# Patient Record
Sex: Male | Born: 1965 | Race: Black or African American | Hispanic: No | Marital: Single | Smoking: Current every day smoker
Health system: Southern US, Community
[De-identification: ages and names within clinical notes are randomized; demographics above are authoritative.]

## PROBLEM LIST (undated history)

## (undated) DIAGNOSIS — I10 Essential (primary) hypertension: Secondary | ICD-10-CM

## (undated) DIAGNOSIS — K219 Gastro-esophageal reflux disease without esophagitis: Secondary | ICD-10-CM

## (undated) HISTORY — DX: Gastro-esophageal reflux disease without esophagitis: K21.9

---

## 1998-12-06 ENCOUNTER — Encounter: Payer: Self-pay | Admitting: *Deleted

## 1998-12-06 ENCOUNTER — Ambulatory Visit (HOSPITAL_COMMUNITY): Admission: RE | Admit: 1998-12-06 | Discharge: 1998-12-06 | Payer: Self-pay | Admitting: *Deleted

## 2001-07-09 ENCOUNTER — Emergency Department (HOSPITAL_COMMUNITY): Admission: EM | Admit: 2001-07-09 | Discharge: 2001-07-09 | Payer: Self-pay | Admitting: Emergency Medicine

## 2001-07-13 ENCOUNTER — Emergency Department (HOSPITAL_COMMUNITY): Admission: EM | Admit: 2001-07-13 | Discharge: 2001-07-13 | Payer: Self-pay | Admitting: Emergency Medicine

## 2001-07-13 ENCOUNTER — Encounter: Payer: Self-pay | Admitting: Emergency Medicine

## 2001-08-12 ENCOUNTER — Emergency Department (HOSPITAL_COMMUNITY): Admission: EM | Admit: 2001-08-12 | Discharge: 2001-08-12 | Payer: Self-pay | Admitting: Emergency Medicine

## 2002-02-05 ENCOUNTER — Emergency Department (HOSPITAL_COMMUNITY): Admission: EM | Admit: 2002-02-05 | Discharge: 2002-02-05 | Payer: Self-pay | Admitting: Emergency Medicine

## 2002-02-05 ENCOUNTER — Encounter: Payer: Self-pay | Admitting: Emergency Medicine

## 2002-04-28 ENCOUNTER — Emergency Department (HOSPITAL_COMMUNITY): Admission: EM | Admit: 2002-04-28 | Discharge: 2002-04-28 | Payer: Self-pay

## 2002-11-08 ENCOUNTER — Emergency Department (HOSPITAL_COMMUNITY): Admission: EM | Admit: 2002-11-08 | Discharge: 2002-11-08 | Payer: Self-pay | Admitting: Emergency Medicine

## 2003-03-03 ENCOUNTER — Emergency Department (HOSPITAL_COMMUNITY): Admission: EM | Admit: 2003-03-03 | Discharge: 2003-03-03 | Payer: Self-pay | Admitting: Emergency Medicine

## 2003-04-30 ENCOUNTER — Emergency Department (HOSPITAL_COMMUNITY): Admission: EM | Admit: 2003-04-30 | Discharge: 2003-04-30 | Payer: Self-pay | Admitting: Emergency Medicine

## 2003-06-09 ENCOUNTER — Emergency Department (HOSPITAL_COMMUNITY): Admission: EM | Admit: 2003-06-09 | Discharge: 2003-06-09 | Payer: Self-pay | Admitting: Emergency Medicine

## 2003-07-03 ENCOUNTER — Emergency Department (HOSPITAL_COMMUNITY): Admission: EM | Admit: 2003-07-03 | Discharge: 2003-07-03 | Payer: Self-pay | Admitting: Emergency Medicine

## 2003-07-03 ENCOUNTER — Encounter: Payer: Self-pay | Admitting: Emergency Medicine

## 2003-07-12 ENCOUNTER — Ambulatory Visit (HOSPITAL_BASED_OUTPATIENT_CLINIC_OR_DEPARTMENT_OTHER): Admission: RE | Admit: 2003-07-12 | Discharge: 2003-07-12 | Payer: Self-pay | Admitting: Dentistry

## 2003-07-15 ENCOUNTER — Inpatient Hospital Stay (HOSPITAL_COMMUNITY): Admission: EM | Admit: 2003-07-15 | Discharge: 2003-07-18 | Payer: Self-pay | Admitting: Emergency Medicine

## 2003-07-15 ENCOUNTER — Encounter: Payer: Self-pay | Admitting: Emergency Medicine

## 2003-09-25 ENCOUNTER — Emergency Department (HOSPITAL_COMMUNITY): Admission: EM | Admit: 2003-09-25 | Discharge: 2003-09-25 | Payer: Self-pay | Admitting: Emergency Medicine

## 2003-12-01 ENCOUNTER — Emergency Department (HOSPITAL_COMMUNITY): Admission: EM | Admit: 2003-12-01 | Discharge: 2003-12-01 | Payer: Self-pay | Admitting: Emergency Medicine

## 2004-05-02 ENCOUNTER — Emergency Department (HOSPITAL_COMMUNITY): Admission: EM | Admit: 2004-05-02 | Discharge: 2004-05-02 | Payer: Self-pay | Admitting: Emergency Medicine

## 2004-06-09 ENCOUNTER — Emergency Department (HOSPITAL_COMMUNITY): Admission: EM | Admit: 2004-06-09 | Discharge: 2004-06-10 | Payer: Self-pay | Admitting: Emergency Medicine

## 2004-06-10 ENCOUNTER — Emergency Department (HOSPITAL_COMMUNITY): Admission: EM | Admit: 2004-06-10 | Discharge: 2004-06-10 | Payer: Self-pay | Admitting: Emergency Medicine

## 2004-08-08 ENCOUNTER — Emergency Department (HOSPITAL_COMMUNITY): Admission: EM | Admit: 2004-08-08 | Discharge: 2004-08-08 | Payer: Self-pay | Admitting: Emergency Medicine

## 2005-03-24 ENCOUNTER — Emergency Department (HOSPITAL_COMMUNITY): Admission: EM | Admit: 2005-03-24 | Discharge: 2005-03-24 | Payer: Self-pay | Admitting: Emergency Medicine

## 2006-06-25 ENCOUNTER — Emergency Department (HOSPITAL_COMMUNITY): Admission: EM | Admit: 2006-06-25 | Discharge: 2006-06-26 | Payer: Self-pay | Admitting: Emergency Medicine

## 2007-03-10 ENCOUNTER — Emergency Department (HOSPITAL_COMMUNITY): Admission: EM | Admit: 2007-03-10 | Discharge: 2007-03-10 | Payer: Self-pay | Admitting: Emergency Medicine

## 2007-12-18 ENCOUNTER — Emergency Department (HOSPITAL_COMMUNITY): Admission: EM | Admit: 2007-12-18 | Discharge: 2007-12-19 | Payer: Self-pay | Admitting: Emergency Medicine

## 2008-09-03 ENCOUNTER — Emergency Department (HOSPITAL_COMMUNITY): Admission: EM | Admit: 2008-09-03 | Discharge: 2008-09-03 | Payer: Self-pay | Admitting: Emergency Medicine

## 2009-06-11 ENCOUNTER — Emergency Department (HOSPITAL_COMMUNITY): Admission: EM | Admit: 2009-06-11 | Discharge: 2009-06-11 | Payer: Self-pay | Admitting: Emergency Medicine

## 2009-06-12 ENCOUNTER — Emergency Department (HOSPITAL_COMMUNITY): Admission: EM | Admit: 2009-06-12 | Discharge: 2009-06-12 | Payer: Self-pay | Admitting: Emergency Medicine

## 2009-07-10 ENCOUNTER — Emergency Department (HOSPITAL_COMMUNITY): Admission: EM | Admit: 2009-07-10 | Discharge: 2009-07-10 | Payer: Self-pay | Admitting: Emergency Medicine

## 2009-10-31 ENCOUNTER — Emergency Department (HOSPITAL_COMMUNITY): Admission: EM | Admit: 2009-10-31 | Discharge: 2009-10-31 | Payer: Self-pay | Admitting: Emergency Medicine

## 2009-11-13 ENCOUNTER — Emergency Department (HOSPITAL_COMMUNITY): Admission: EM | Admit: 2009-11-13 | Discharge: 2009-11-13 | Payer: Self-pay | Admitting: Emergency Medicine

## 2009-11-14 ENCOUNTER — Inpatient Hospital Stay (HOSPITAL_COMMUNITY): Admission: EM | Admit: 2009-11-14 | Discharge: 2009-11-16 | Payer: Self-pay | Admitting: Emergency Medicine

## 2010-03-28 ENCOUNTER — Emergency Department (HOSPITAL_COMMUNITY): Admission: EM | Admit: 2010-03-28 | Discharge: 2010-03-28 | Payer: Self-pay | Admitting: Emergency Medicine

## 2010-03-30 ENCOUNTER — Emergency Department (HOSPITAL_COMMUNITY): Admission: EM | Admit: 2010-03-30 | Discharge: 2010-03-30 | Payer: Self-pay | Admitting: Emergency Medicine

## 2010-06-19 ENCOUNTER — Emergency Department (HOSPITAL_COMMUNITY): Admission: EM | Admit: 2010-06-19 | Discharge: 2010-06-19 | Payer: Self-pay | Admitting: Emergency Medicine

## 2010-06-30 ENCOUNTER — Emergency Department (HOSPITAL_COMMUNITY): Admission: EM | Admit: 2010-06-30 | Discharge: 2010-06-30 | Payer: Self-pay | Admitting: Emergency Medicine

## 2011-01-30 LAB — URINALYSIS, ROUTINE W REFLEX MICROSCOPIC
Ketones, ur: 15 mg/dL — AB
Nitrite: NEGATIVE
Urobilinogen, UA: 0.2 mg/dL (ref 0.0–1.0)
pH: 5 (ref 5.0–8.0)

## 2011-01-30 LAB — CBC
MCH: 31.2 pg (ref 26.0–34.0)
Platelets: 252 10*3/uL (ref 150–400)
RBC: 4.26 MIL/uL (ref 4.22–5.81)
WBC: 20.8 10*3/uL — ABNORMAL HIGH (ref 4.0–10.5)

## 2011-01-30 LAB — BASIC METABOLIC PANEL
CO2: 23 mEq/L (ref 19–32)
Creatinine, Ser: 1.32 mg/dL (ref 0.4–1.5)
Glucose, Bld: 74 mg/dL (ref 70–99)
Potassium: 3.1 mEq/L — ABNORMAL LOW (ref 3.5–5.1)
Sodium: 140 mEq/L (ref 135–145)

## 2011-01-30 LAB — ETHANOL: Alcohol, Ethyl (B): 128 mg/dL — ABNORMAL HIGH (ref 0–10)

## 2011-01-30 LAB — DIFFERENTIAL
Basophils Absolute: 0 10*3/uL (ref 0.0–0.1)
Eosinophils Absolute: 0 10*3/uL (ref 0.0–0.7)
Lymphocytes Relative: 4 % — ABNORMAL LOW (ref 12–46)
Lymphs Abs: 0.8 10*3/uL (ref 0.7–4.0)
Neutrophils Relative %: 94 % — ABNORMAL HIGH (ref 43–77)

## 2011-01-30 LAB — LACTIC ACID, PLASMA: Lactic Acid, Venous: 3.9 mmol/L — ABNORMAL HIGH (ref 0.5–2.2)

## 2011-01-31 LAB — BASIC METABOLIC PANEL
BUN: 6 mg/dL (ref 6–23)
Calcium: 8.3 mg/dL — ABNORMAL LOW (ref 8.4–10.5)
Creatinine, Ser: 1.09 mg/dL (ref 0.4–1.5)
GFR calc non Af Amer: >60 mL/min (ref >60)
Potassium: 4.2 mEq/L (ref 3.5–5.1)

## 2011-02-02 LAB — BASIC METABOLIC PANEL
CO2: 29 mEq/L (ref 19–32)
Chloride: 102 mEq/L (ref 96–112)
GFR calc Af Amer: >60 mL/min (ref >60)
Glucose, Bld: 97 mg/dL (ref 70–99)
Potassium: 3.6 mEq/L (ref 3.5–5.1)
Sodium: 138 mEq/L (ref 135–145)

## 2011-02-02 LAB — DIFFERENTIAL
Basophils Absolute: 0.1 10*3/uL (ref 0.0–0.1)
Basophils Relative: 2 % — ABNORMAL HIGH (ref 0–1)
Eosinophils Relative: 1 % (ref 0–5)
Monocytes Absolute: 0.6 10*3/uL (ref 0.1–1.0)
Monocytes Relative: 7 % (ref 3–12)

## 2011-02-02 LAB — CBC
HCT: 44.6 % (ref 39.0–52.0)
Hemoglobin: 14.8 g/dL (ref 13.0–17.0)
MCHC: 33.3 g/dL (ref 30.0–36.0)
RBC: 4.9 MIL/uL (ref 4.22–5.81)
RDW: 12.6 % (ref 11.5–15.5)

## 2011-02-02 LAB — WOUND CULTURE

## 2011-02-16 LAB — CBC
HCT: 41.5 % (ref 39.0–52.0)
Hemoglobin: 16.4 g/dL (ref 13.0–17.0)
MCHC: 32.8 g/dL (ref 30.0–36.0)
MCHC: 33.8 g/dL (ref 30.0–36.0)
MCV: 91 fL (ref 78.0–100.0)
Platelets: 163 10*3/uL (ref 150–400)
Platelets: 181 10*3/uL (ref 150–400)
RBC: 5.33 MIL/uL (ref 4.22–5.81)
RDW: 12.6 % (ref 11.5–15.5)
RDW: 12.8 % (ref 11.5–15.5)
RDW: 13.1 % (ref 11.5–15.5)
WBC: 8.1 10*3/uL (ref 4.0–10.5)

## 2011-02-16 LAB — COMPREHENSIVE METABOLIC PANEL
ALT: 17 U/L (ref 0–53)
AST: 26 U/L (ref 0–37)
AST: 30 U/L (ref 0–37)
Albumin: 2.9 g/dL — ABNORMAL LOW (ref 3.5–5.2)
Albumin: 3.6 g/dL (ref 3.5–5.2)
Alkaline Phosphatase: 49 U/L (ref 39–117)
BUN: 11 mg/dL (ref 6–23)
CO2: 24 mEq/L (ref 19–32)
Calcium: 7.7 mg/dL — ABNORMAL LOW (ref 8.4–10.5)
Calcium: 9.3 mg/dL (ref 8.4–10.5)
Chloride: 101 mEq/L (ref 96–112)
Creatinine, Ser: 1.38 mg/dL (ref 0.4–1.5)
GFR calc Af Amer: >60 mL/min (ref >60)
GFR calc Af Amer: >60 mL/min (ref >60)
GFR calc non Af Amer: 57 mL/min — ABNORMAL LOW (ref >60)
Glucose, Bld: 97 mg/dL (ref 70–99)
Potassium: 3.7 mEq/L (ref 3.5–5.1)
Sodium: 134 mEq/L — ABNORMAL LOW (ref 135–145)
Sodium: 137 mEq/L (ref 135–145)
Sodium: 138 mEq/L (ref 135–145)
Total Protein: 5.7 g/dL — ABNORMAL LOW (ref 6.0–8.3)
Total Protein: 6.9 g/dL (ref 6.0–8.3)
Total Protein: 7.7 g/dL (ref 6.0–8.3)

## 2011-02-16 LAB — CARDIAC PANEL(CRET KIN+CKTOT+MB+TROPI)
CK, MB: 2.9 ng/mL (ref 0.3–4.0)
CK, MB: 2.9 ng/mL (ref 0.3–4.0)
CK, MB: 3 ng/mL (ref 0.3–4.0)
CK, MB: 3.7 ng/mL (ref 0.3–4.0)
Relative Index: 0.4 (ref 0.0–2.5)
Relative Index: 0.4 (ref 0.0–2.5)
Total CK: 606 U/L — ABNORMAL HIGH (ref 7–232)
Total CK: 679 U/L — ABNORMAL HIGH (ref 7–232)
Total CK: 723 U/L — ABNORMAL HIGH (ref 7–232)
Total CK: 885 U/L — ABNORMAL HIGH (ref 7–232)
Troponin I: 0.01 ng/mL (ref 0.00–0.06)
Troponin I: 0.02 ng/mL (ref 0.00–0.06)

## 2011-02-16 LAB — CULTURE, BLOOD (ROUTINE X 2)
Culture: NO GROWTH
Culture: NO GROWTH

## 2011-02-16 LAB — TSH
TSH: 0.874 u[IU]/mL (ref 0.350–4.500)
TSH: 1.342 u[IU]/mL (ref 0.350–4.500)

## 2011-02-16 LAB — URINALYSIS, ROUTINE W REFLEX MICROSCOPIC
Bilirubin Urine: NEGATIVE
Hgb urine dipstick: NEGATIVE
Ketones, ur: >80 mg/dL — AB
Nitrite: NEGATIVE
Specific Gravity, Urine: 1.015 (ref 1.005–1.030)
Urobilinogen, UA: 0.2 mg/dL (ref 0.0–1.0)
pH: 6 (ref 5.0–8.0)

## 2011-02-16 LAB — DIFFERENTIAL
Basophils Relative: 0 % (ref 0–1)
Eosinophils Relative: 1 % (ref 0–5)
Lymphocytes Relative: 14 % (ref 12–46)
Lymphocytes Relative: 3 % — ABNORMAL LOW (ref 12–46)
Lymphs Abs: 0.6 10*3/uL — ABNORMAL LOW (ref 0.7–4.0)
Monocytes Absolute: 1.2 10*3/uL — ABNORMAL HIGH (ref 0.1–1.0)
Monocytes Relative: 15 % — ABNORMAL HIGH (ref 3–12)
Monocytes Relative: 4 % (ref 3–12)
Neutro Abs: 15.9 10*3/uL — ABNORMAL HIGH (ref 1.7–7.7)
Neutro Abs: 5.8 10*3/uL (ref 1.7–7.7)
Neutrophils Relative %: 92 % — ABNORMAL HIGH (ref 43–77)

## 2011-02-16 LAB — LIPASE, BLOOD: Lipase: 15 U/L (ref 11–59)

## 2011-02-16 LAB — RAPID URINE DRUG SCREEN, HOSP PERFORMED
Amphetamines: NOT DETECTED
Tetrahydrocannabinol: NOT DETECTED

## 2011-02-16 LAB — D-DIMER, QUANTITATIVE: D-Dimer, Quant: 0.47 ug/mL-FEU (ref 0.00–0.48)

## 2011-02-22 LAB — CBC
HCT: 44.7 % (ref 39.0–52.0)
Hemoglobin: 15.2 g/dL (ref 13.0–17.0)
MCV: 90.7 fL (ref 78.0–100.0)
RBC: 4.93 MIL/uL (ref 4.22–5.81)
WBC: 9.8 10*3/uL (ref 4.0–10.5)

## 2011-02-22 LAB — LIPASE, BLOOD: Lipase: 23 U/L (ref 11–59)

## 2011-02-22 LAB — COMPREHENSIVE METABOLIC PANEL
Alkaline Phosphatase: 72 U/L (ref 39–117)
BUN: 13 mg/dL (ref 6–23)
CO2: 26 mEq/L (ref 19–32)
Chloride: 107 mEq/L (ref 96–112)
Creatinine, Ser: 1.13 mg/dL (ref 0.4–1.5)
GFR calc non Af Amer: >60 mL/min (ref >60)
Glucose, Bld: 98 mg/dL (ref 70–99)
Potassium: 4.3 mEq/L (ref 3.5–5.1)
Total Bilirubin: 0.5 mg/dL (ref 0.3–1.2)

## 2011-02-22 LAB — DIFFERENTIAL
Basophils Absolute: 0.1 10*3/uL (ref 0.0–0.1)
Basophils Relative: 1 % (ref 0–1)
Lymphocytes Relative: 24 % (ref 12–46)
Monocytes Absolute: 0.8 10*3/uL (ref 0.1–1.0)
Neutro Abs: 6.5 10*3/uL (ref 1.7–7.7)

## 2011-02-22 LAB — URINALYSIS, ROUTINE W REFLEX MICROSCOPIC
Glucose, UA: NEGATIVE mg/dL
Hgb urine dipstick: NEGATIVE
Protein, ur: NEGATIVE mg/dL
Specific Gravity, Urine: 1.013 (ref 1.005–1.030)
pH: 7 (ref 5.0–8.0)

## 2011-02-22 LAB — HEMOCCULT GUIAC POC 1CARD (OFFICE): Fecal Occult Bld: NEGATIVE

## 2011-04-03 NOTE — Op Note (Signed)
NAME:  Joseph [name], Joseph [name] NO.:  [id number]   MEDICAL RECORD NO.:  [id number]                   PATIENT TYPE:  AMB   LOCATION:  DSC                                  FACILITY:  [facility]   PHYSICIAN:  Griffith Citron Mohorn, D.D.S.          DATE OF BIRTH:  03-Aug-1966   DATE OF PROCEDURE:  07/12/2003  DATE OF DISCHARGE:                                 OPERATIVE REPORT   PREOPERATIVE DIAGNOSIS:  Left mandibular dental abscess.   POSTOPERATIVE DIAGNOSIS:  Left mandibular dental abscess.   OPERATION PERFORMED:  Surgical extraction of tooth #18, incision and  drainage with placement of intraoral drain.   SURGEON:  Griffith Citron Mohorn, D.D.S.   ANESTHESIA:  General.   ESTIMATED BLOOD LOSS:  Minimal.   INDICATIONS FOR PROCEDURE:  The patient was evaluated in the office  approximately two days ago and was found to have a moderately swollen left  mandibular abscess with moderate trismus.  Initial drainage was established  after consultation.  It was determined that due to the abscess, the  procedure would have to be performed with at least an IV sedation.  He  returned to the office 24 hours later to have the procedure performed at  which time surgeon performed the procedure.  I was unable to get a safe  level of sedation due to the patient's excessive coughing.  The procedure  was aborted and it was determined that he would require better protection of  the airway in order to be sedated and have the tooth removed safely.  He was  scheduled for incision and drainage and extraction of tooth #18 under  general anesthesia at [facility].   DESCRIPTION OF PROCEDURE:  The patient was taken to the operating room and  placed in the supine position.  Once general anesthesia was induced by  orotracheal intubation, the patient was prepped and draped for a  maxillofacial procedure of this time.  Initially, 2mL of 2% lidocaine with  1:100,000 epinephrine was  injected into the left mandible.  Next, tooth #18  was removed surgically in multiple pieces.  No significant draining was  produced through the socket.  A stab incision made in the vestibule adjacent  to the extraction site was reopened and an additional 1.20mL of purulent  drainage was produced.  The site was irrigated copiously with normal saline.  A 1/4 inch Penrose drain was then sutured to the mucosa utilizing a 4-0 silk  suture.  The throat pack which had been placed preoperatively was then  removed and the patient was allowed to awaken and extubated without  complications. He was transferred to the PACU  in sable and satisfactory condition.  The patient will follow up in the  office in 48 hours for drain removal and will continue with his oral  antibiotics.  Griffith Citron Mohorn, D.D.S.    Gardenia Phlegm  D:  07/12/2003  T:  07/12/2003  Job:  [id number]
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
## 2011-08-07 LAB — COMPREHENSIVE METABOLIC PANEL
AST: 47 — ABNORMAL HIGH
Albumin: 4.4
BUN: 7
Chloride: 102
Creatinine, Ser: 1.41
GFR calc Af Amer: >60
Potassium: 5.3 — ABNORMAL HIGH
Total Protein: 7

## 2011-08-07 LAB — AMYLASE: Amylase: 125

## 2011-08-07 LAB — LIPASE, BLOOD: Lipase: 13

## 2011-08-07 LAB — CBC
HCT: 47.2
MCV: 88.5
Platelets: 330
RDW: 13.4
WBC: 19 — ABNORMAL HIGH

## 2011-08-07 LAB — URINALYSIS, ROUTINE W REFLEX MICROSCOPIC
Bilirubin Urine: NEGATIVE
Glucose, UA: NEGATIVE
Hgb urine dipstick: NEGATIVE
Specific Gravity, Urine: 1.022
pH: 5.5

## 2011-08-07 LAB — URINE MICROSCOPIC-ADD ON

## 2011-08-25 ENCOUNTER — Emergency Department (HOSPITAL_COMMUNITY)
Admission: EM | Admit: 2011-08-25 | Discharge: 2011-08-25 | Disposition: A | Discharge status: Home / Self Care | Payer: Self-pay | Attending: Emergency Medicine | Admitting: Emergency Medicine

## 2012-12-08 ENCOUNTER — Emergency Department (HOSPITAL_COMMUNITY)
Admission: EM | Admit: 2012-12-08 | Discharge: 2012-12-08 | Disposition: A | Discharge status: Home / Self Care | Payer: Self-pay | Attending: Emergency Medicine | Admitting: Emergency Medicine

## 2012-12-08 ENCOUNTER — Encounter (HOSPITAL_COMMUNITY): Payer: Self-pay | Admitting: Emergency Medicine

## 2012-12-08 ENCOUNTER — Emergency Department (HOSPITAL_COMMUNITY): Payer: Self-pay

## 2012-12-08 DIAGNOSIS — X58XXXA Exposure to other specified factors, initial encounter: Secondary | ICD-10-CM | POA: Insufficient documentation

## 2012-12-08 DIAGNOSIS — Y939 Activity, unspecified: Secondary | ICD-10-CM | POA: Insufficient documentation

## 2012-12-08 DIAGNOSIS — S62619A Displaced fracture of proximal phalanx of unspecified finger, initial encounter for closed fracture: Secondary | ICD-10-CM

## 2012-12-08 DIAGNOSIS — F17200 Nicotine dependence, unspecified, uncomplicated: Secondary | ICD-10-CM | POA: Insufficient documentation

## 2012-12-08 DIAGNOSIS — IMO0002 Reserved for concepts with insufficient information to code with codable children: Secondary | ICD-10-CM | POA: Insufficient documentation

## 2012-12-08 DIAGNOSIS — Y929 Unspecified place or not applicable: Secondary | ICD-10-CM | POA: Insufficient documentation

## 2012-12-08 MED ORDER — IBUPROFEN 800 MG PO TABS: 800.0000 mg | ORAL_TABLET | Freq: Three times a day (TID) | ORAL | Status: DC | PRN

## 2012-12-08 MED ORDER — HYDROCODONE-ACETAMINOPHEN 5-325 MG PO TABS: 1.0000 | ORAL_TABLET | Freq: Four times a day (QID) | ORAL | Status: DC | PRN

## 2012-12-08 NOTE — ED Notes (Signed)
Ortho at bedside to place splint.

## 2012-12-08 NOTE — ED Notes (Signed)
Pt complains of left hand pain, itching , and swelling x 1 week.

## 2012-12-08 NOTE — ED Provider Notes (Signed)
History     CSN: [id number]  Arrival date & time 12/08/12  1200   First MD Initiated Contact with Patient 12/08/12 1214      Chief Complaint  Patient presents with  . Hand Pain    (Consider location/radiation/quality/duration/timing/severity/associated sxs/prior treatment) HPI Patient presents to the emergency department complaining of left 3rd and 4th MCP pain. He says the pain is achy, worse in the morning and accompanied by stiffness in the morning. He occasionally has tingling sensations in his distal 3rd and 4th fingers. No numbness, no fever, chills, chest pain, nausea, vomiting. He does not recall an injury. 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   History reviewed. No pertinent past medical history.  History reviewed. No pertinent past surgical history.  No family history on file.  History  Substance Use Topics  . Smoking status: Current Every Day Smoker -- 3.0 packs/day    Types: Cigarettes  . Smokeless tobacco: Current User  . Alcohol Use: Yes      Review of Systems All other systems negative except as documented in the HPI. All pertinent positives and negatives as reviewed in the HPI.   Allergies  Codeine  Home Medications  No current outpatient prescriptions on file.  BP 124/69  Pulse 75  Temp 97.9 F (36.6 C) (Oral)  Resp 16  SpO2 99%  Physical Exam  Constitutional: He appears well-developed and well-nourished. No distress.  HENT:  Head: Normocephalic and atraumatic.  Eyes: Conjunctivae normal are normal. No scleral icterus.  Cardiovascular: Normal rate.   Pulmonary/Chest: Effort normal.  Musculoskeletal: Normal range of motion.       Left hand: He exhibits bony tenderness and swelling. He exhibits normal range of motion, normal capillary refill and no deformity. normal sensation noted.       Hands:   ED Course  Procedures (including critical care time)  Labs Reviewed - No data to display Dg Hand Complete Left  12/08/2012   *RADIOLOGY REPORT*  Clinical Data: Pain in the left hand.  LEFT HAND - COMPLETE 3+ VIEW  Comparison: Left fourth finger radiographs 06/09/2004.  Findings: Three views of the left hand demonstrate old healed fractures of the ulnar aspect of the base of the third proximal phalanx, and at the base of the fifth metacarpal.  There is a tiny bony fragment adjacent to the radial aspect of the base of the fourth proximal phalanx, which could represent a tiny avulsion fracture.  No other acute displaced fracture, subluxation or dislocation is noted.  IMPRESSION: 1.  Possible capsular avulsion fracture from the radial aspect of the base of the fourth proximal phalanx. 2.  Old healed fractures in the third proximal phalanx and the fifth metacarpal, as above.   Original Report Authenticated By: Trudie Reed, M.D.      The patient will be referred to hand for follow up. Return here as needed. Ice and elevate the hand  MDM          Carlyle Dolly, PA-C 12/09/12 1938

## 2012-12-10 NOTE — ED Provider Notes (Signed)
Medical screening examination/treatment/procedure(s) were performed by non-physician practitioner and as supervising physician I was immediately available for consultation/collaboration.   Rolan Bucco, MD 12/10/12 1420

## 2013-02-15 ENCOUNTER — Encounter (HOSPITAL_COMMUNITY): Payer: Self-pay | Admitting: *Deleted

## 2013-02-15 ENCOUNTER — Emergency Department (HOSPITAL_COMMUNITY): Payer: Self-pay

## 2013-02-15 ENCOUNTER — Emergency Department (HOSPITAL_COMMUNITY)
Admission: EM | Admit: 2013-02-15 | Discharge: 2013-02-15 | Disposition: A | Discharge status: Home / Self Care | Payer: Self-pay | Attending: Emergency Medicine | Admitting: Emergency Medicine

## 2013-02-15 DIAGNOSIS — J069 Acute upper respiratory infection, unspecified: Secondary | ICD-10-CM

## 2013-02-15 DIAGNOSIS — R071 Chest pain on breathing: Secondary | ICD-10-CM | POA: Insufficient documentation

## 2013-02-15 DIAGNOSIS — R059 Cough, unspecified: Secondary | ICD-10-CM | POA: Insufficient documentation

## 2013-02-15 DIAGNOSIS — F17200 Nicotine dependence, unspecified, uncomplicated: Secondary | ICD-10-CM | POA: Insufficient documentation

## 2013-02-15 DIAGNOSIS — R05 Cough: Secondary | ICD-10-CM | POA: Insufficient documentation

## 2013-02-15 LAB — BASIC METABOLIC PANEL
CO2: 24 mEq/L (ref 19–32)
Calcium: 9.1 mg/dL (ref 8.4–10.5)
GFR calc non Af Amer: >90 mL/min (ref >90)
Glucose, Bld: 91 mg/dL (ref 70–99)
Potassium: 4.3 mEq/L (ref 3.5–5.1)
Sodium: 137 mEq/L (ref 135–145)

## 2013-02-15 LAB — CBC WITH DIFFERENTIAL/PLATELET
Basophils Absolute: 0 10*3/uL (ref 0.0–0.1)
Eosinophils Absolute: 0.1 10*3/uL (ref 0.0–0.7)
Lymphocytes Relative: 20 % (ref 12–46)
Lymphs Abs: 1.5 10*3/uL (ref 0.7–4.0)
Neutrophils Relative %: 70 % (ref 43–77)
Platelets: 294 10*3/uL (ref 150–400)
RBC: 5.12 MIL/uL (ref 4.22–5.81)
RDW: 13 % (ref 11.5–15.5)
WBC: 7.5 10*3/uL (ref 4.0–10.5)

## 2013-02-15 LAB — POCT I-STAT TROPONIN I: Troponin i, poc: 0.01 ng/mL (ref 0.00–0.08)

## 2013-02-15 MED ORDER — PREDNISONE 20 MG PO TABS
60.0000 mg | ORAL_TABLET | Freq: Once | ORAL | Status: AC
  Administered 2013-02-15: 60 mg via ORAL
  Filled 2013-02-15: qty 3

## 2013-02-15 MED ORDER — ALBUTEROL SULFATE (5 MG/ML) 0.5% IN NEBU
5.0000 mg | INHALATION_SOLUTION | Freq: Once | RESPIRATORY_TRACT | Status: AC
  Administered 2013-02-15: 5 mg via RESPIRATORY_TRACT
  Filled 2013-02-15: qty 1

## 2013-02-15 MED ORDER — IPRATROPIUM BROMIDE 0.02 % IN SOLN
0.5000 mg | Freq: Once | RESPIRATORY_TRACT | Status: AC
  Administered 2013-02-15: 0.5 mg via RESPIRATORY_TRACT
  Filled 2013-02-15: qty 2.5

## 2013-02-15 MED ORDER — PREDNISONE 20 MG PO TABS: 60.0000 mg | ORAL_TABLET | Freq: Every day | ORAL | Status: DC

## 2013-02-15 NOTE — ED Notes (Signed)
Pt escorted to discharge window. Pt verbalized understanding discharge instructions. In no acute distress.  

## 2013-02-15 NOTE — ED Notes (Signed)
RT at bedside.

## 2013-02-15 NOTE — ED Notes (Signed)
Pt back from x-ray.

## 2013-02-15 NOTE — ED Notes (Addendum)
Pt reports non productive cough x3 days, chest pain and upper abdominal pain since last night 7/10. Reports SOB, speaking in full sentences. Denies n/v/d  Hx of bronchitis in past

## 2013-02-15 NOTE — ED Notes (Signed)
PA at bedside Pt alert and oriented x4. Respirations even and unlabored, bilateral symmetrical rise and fall of chest. Skin warm and dry. In no acute distress. Denies needs.   

## 2013-02-15 NOTE — ED Provider Notes (Signed)
History     CSN: [id number]  Arrival date & time 02/15/13  4098   First MD Initiated Contact with Patient 02/15/13 [phone]      Chief Complaint  Patient presents with  . Shortness of Breath  . Chest Pain    (Consider location/radiation/quality/duration/timing/severity/associated sxs/prior treatment) HPI Comments: Patient presents with a chief complaint of shortness of breath and cough.  He reports that his cough has been present for the past three days.  Cough is non productive.  He began feeling short of breath this morning around 3 AM. He reports that he also began having chest pain this morning.  However, he only has chest pain with movement and with coughing really hard.  No pain when he is lying still.   He reports that he has had similar symptoms in the past and told that he had Bronchitis.  He reports that in the past when he has had these symptoms he was given a breathing treatment in the ED, which helped.  He denies any known history of COPD.  He has smoked for 25 years.  He currently smokes 0.5 PPD.  He denies fever or chills.  He denies nausea, vomiting, or diarrhea.  Patient denies prolonged travel or surgeries in the past 4 weeks.  Denies hemoptysis.  No prior history of Cancer.  No lower extremity pain or edema.  No history of DVT or PE.  No cardiac history.  No history of HTN, DM, or Hyperlipidemia.  The history is provided by the patient.    History reviewed. No pertinent past medical history.  History reviewed. No pertinent past surgical history.  History reviewed. No pertinent family history.  History  Substance Use Topics  . Smoking status: Current Every Day Smoker -- 0.10 packs/day for 25 years    Types: Cigarettes  . Smokeless tobacco: Current User  . Alcohol Use: Yes     Comment: 6 pack/week      Review of Systems  Constitutional: Negative for fever and chills.  HENT: Positive for congestion and rhinorrhea.   Respiratory: Positive for cough and shortness of  breath.   Cardiovascular:       Chest pain only when coughing and with movement  Skin: Negative for color change.  All other systems reviewed and are negative.    Allergies  Codeine  Home Medications   Current Outpatient Rx  Name  Route  Sig  Dispense  Refill  . HYDROcodone-acetaminophen (NORCO/VICODIN) 5-325 MG per tablet   Oral   Take 1 tablet by mouth every 6 (six) hours as needed for pain.   15 tablet   0   . ibuprofen (ADVIL,MOTRIN) 800 MG tablet   Oral   Take 1 tablet (800 mg total) by mouth every 8 (eight) hours as needed for pain.   21 tablet   0     BP 125/89  Pulse 75  Temp(Src) 97.3 F (36.3 C)  Resp 17  SpO2 100%  Physical Exam  Nursing note and vitals reviewed. Constitutional: He appears well-developed and well-nourished. No distress.  HENT:  Head: Normocephalic and atraumatic.  Mouth/Throat: Oropharynx is clear and moist.  Neck: Normal range of motion. Neck supple.  Cardiovascular: Normal rate, regular rhythm and normal heart sounds.   Pulmonary/Chest: Effort normal and breath sounds normal. No respiratory distress. He has no wheezes. He has no rales. He exhibits no tenderness.  Abdominal: Soft. Bowel sounds are normal. He exhibits no distension and no mass. There is no tenderness. There  is no rebound and no guarding.  Musculoskeletal:  No LE edema or erythema.  Neurological: He is alert.  Skin: Skin is warm and dry. He is not diaphoretic.  Psychiatric: He has a normal mood and affect.    ED Course  Procedures (including critical care time)  Labs Reviewed - No data to display Dg Chest 2 View  02/15/2013  *RADIOLOGY REPORT*  Clinical Data: Left chest pain, shortness of breath, cough, smoker  CHEST - 2 VIEW  Comparison: 06/30/2010  Findings: Normal heart size, mediastinal contours, and pulmonary vascularity. Lungs clear. No pleural effusion or pneumothorax. Bones unremarkable.  IMPRESSION: No acute abnormalities.   Original Report Authenticated By:  Ulyses Southward, M.D.      No diagnosis found.  10:38 AM Patient reports that his SOB has improved after getting the breathing treatment.   Date: 02/15/2013  Rate: 74  Rhythm: normal sinus rhythm  QRS Axis: normal  Intervals: normal  ST/T Wave abnormalities: nonspecific T wave changes  Conduction Disutrbances:none  Narrative Interpretation:   Old EKG Reviewed: changes noted, t waves more prominent  Patient reports that SOB had improved after getting breathing treatment.   MDM  Patient presenting with SOB and cough.  EKG unremarkable.  CXR negative.  Negative Troponin.  Patient without overt wheezing, but given breathing treatment for possible symptomatic relief.  SOB improved after breathing treatment.  Patient is PERC negative.  Feel that patient is stable for discharge.  Symptoms most consistent with Viral URI.        Pascal Lux [place], PA-C 02/16/13 [phone]

## 2013-02-17 NOTE — ED Provider Notes (Signed)
Medical screening examination/treatment/procedure(s) were performed by non-physician practitioner and as supervising physician I was immediately available for consultation/collaboration.  Juliet Rude. Rubin Payor, MD 02/17/13 [phone]

## 2013-02-21 ENCOUNTER — Emergency Department (HOSPITAL_COMMUNITY): Payer: Self-pay

## 2013-02-21 ENCOUNTER — Encounter (HOSPITAL_COMMUNITY): Payer: Self-pay

## 2013-02-21 ENCOUNTER — Emergency Department (HOSPITAL_COMMUNITY)
Admission: EM | Admit: 2013-02-21 | Discharge: 2013-02-21 | Discharge status: Against Medical Advice | Payer: Self-pay | Attending: Emergency Medicine | Admitting: Emergency Medicine

## 2013-02-21 DIAGNOSIS — R079 Chest pain, unspecified: Secondary | ICD-10-CM | POA: Insufficient documentation

## 2013-02-21 DIAGNOSIS — F17200 Nicotine dependence, unspecified, uncomplicated: Secondary | ICD-10-CM | POA: Insufficient documentation

## 2013-02-21 LAB — POCT I-STAT, CHEM 8
Creatinine, Ser: 1.4 mg/dL — ABNORMAL HIGH (ref 0.50–1.35)
HCT: 48 % (ref 39.0–52.0)
Hemoglobin: 16.3 g/dL (ref 13.0–17.0)
Potassium: 4.2 mEq/L (ref 3.5–5.1)
Sodium: 141 mEq/L (ref 135–145)
TCO2: 30 mmol/L (ref 0–100)

## 2013-02-21 LAB — D-DIMER, QUANTITATIVE: D-Dimer, Quant: 0.32 ug/mL-FEU (ref 0.00–0.48)

## 2013-02-21 LAB — POCT I-STAT TROPONIN I: Troponin i, poc: 0 ng/mL (ref 0.00–0.08)

## 2013-02-21 LAB — URINALYSIS, ROUTINE W REFLEX MICROSCOPIC
Hgb urine dipstick: NEGATIVE
Leukocytes, UA: NEGATIVE
Nitrite: NEGATIVE
Protein, ur: NEGATIVE mg/dL
Specific Gravity, Urine: 1.014 (ref 1.005–1.030)
Urobilinogen, UA: 0.2 mg/dL (ref 0.0–1.0)

## 2013-02-21 LAB — CBC
HCT: 42.7 % (ref 39.0–52.0)
MCHC: 34 g/dL (ref 30.0–36.0)
Platelets: 316 10*3/uL (ref 150–400)
RDW: 13.3 % (ref 11.5–15.5)
WBC: 8.8 10*3/uL (ref 4.0–10.5)

## 2013-02-21 NOTE — ED Notes (Signed)
Pt states seen here last week for same, c/o center chest pain radiating to upper epigastric area and through to rt flank/back area with SOB, denies n/v

## 2013-02-21 NOTE — ED Provider Notes (Signed)
History     CSN: [id number]  Arrival date & time 02/21/13  4098   First MD Initiated Contact with Patient 02/21/13 1047      Chief Complaint  Patient presents with  . Chest Pain    (Consider location/radiation/quality/duration/timing/severity/associated sxs/prior treatment) Patient is a 47 y.o. male presenting with chest pain. The history is provided by the patient.  Chest Pain  patient is here complaining of chest discomfort that is intermittent x2 weeks. Pain characterized as pressure and localized to his upper epigastric area going through to his back. Worse with certain positions. No anginal type symptoms of diaphoresis or dyspnea. No exertional component to this. Questionable association with food. No prior history of angina. No medications taken for this prior to arrival. Seen in the ER for similar symptoms recently without diagnosis.  History reviewed. No pertinent past medical history.  History reviewed. No pertinent past surgical history.  No family history on file.  History  Substance Use Topics  . Smoking status: Current Every Day Smoker -- 0.10 packs/day for 25 years    Types: Cigarettes  . Smokeless tobacco: Current User  . Alcohol Use: Yes     Comment: 6 pack/week      Review of Systems  Cardiovascular: Positive for chest pain.  All other systems reviewed and are negative.    Allergies  Codeine  Home Medications  No current outpatient prescriptions on file.  BP 132/73  Pulse 92  Temp(Src) 98 F (36.7 C) (Oral)  Resp 16  SpO2 100%  Physical Exam  Nursing note and vitals reviewed. Constitutional: He is oriented to person, place, and time. He appears well-developed and well-nourished.  Non-toxic appearance. No distress.  HENT:  Head: Normocephalic and atraumatic.  Eyes: Conjunctivae, EOM and lids are normal. Pupils are equal, round, and reactive to light.  Neck: Normal range of motion. Neck supple. No tracheal deviation present. No mass present.   Cardiovascular: Normal rate, regular rhythm and normal heart sounds.  Exam reveals no gallop.   No murmur heard. Pulmonary/Chest: Effort normal and breath sounds normal. No stridor. No respiratory distress. He has no decreased breath sounds. He has no wheezes. He has no rhonchi. He has no rales.  Abdominal: Soft. Normal appearance and bowel sounds are normal. He exhibits no distension. There is no tenderness. There is no rebound and no CVA tenderness.  Musculoskeletal: Normal range of motion. He exhibits no edema and no tenderness.  Neurological: He is alert and oriented to person, place, and time. He has normal strength. No cranial nerve deficit or sensory deficit. GCS eye subscore is 4. GCS verbal subscore is 5. GCS motor subscore is 6.  Skin: Skin is warm and dry. No abrasion and no rash noted.  Psychiatric: He has a normal mood and affect. His speech is normal and behavior is normal.    ED Course  Procedures (including critical care time)  Labs Reviewed  POCT I-STAT, CHEM 8 - Abnormal; Notable for the following:    Creatinine, Ser 1.40 (*)    All other components within normal limits  CBC  URINALYSIS, ROUTINE W REFLEX MICROSCOPIC  D-DIMER, QUANTITATIVE  POCT I-STAT TROPONIN I   No results found.   No diagnosis found.    MDM   Date: 02/21/2013  Rate: 89  Rhythm: normal sinus rhythm  QRS Axis: normal  Intervals: normal  ST/T Wave abnormalities: early repolarization  Conduction Disutrbances:none  Narrative Interpretation:   Old EKG Reviewed: none available   Pt left before  I could give him his results       Toy Baker, MD 02/21/13 1253

## 2013-02-21 NOTE — ED Notes (Signed)
Bed:WA01<BR> Expected date:<BR> Expected time:<BR> Means of arrival:<BR> Comments:<BR>

## 2013-02-21 NOTE — ED Notes (Signed)
Pt not in room at this time

## 2013-10-27 ENCOUNTER — Encounter (HOSPITAL_COMMUNITY): Payer: Self-pay | Admitting: Emergency Medicine

## 2013-10-27 ENCOUNTER — Emergency Department (HOSPITAL_COMMUNITY)
Admission: EM | Admit: 2013-10-27 | Discharge: 2013-10-27 | Disposition: A | Discharge status: Home / Self Care | Payer: Self-pay | Attending: Emergency Medicine | Admitting: Emergency Medicine

## 2013-10-27 DIAGNOSIS — Y939 Activity, unspecified: Secondary | ICD-10-CM | POA: Insufficient documentation

## 2013-10-27 DIAGNOSIS — S60219A Contusion of unspecified wrist, initial encounter: Secondary | ICD-10-CM | POA: Insufficient documentation

## 2013-10-27 DIAGNOSIS — M25531 Pain in right wrist: Secondary | ICD-10-CM

## 2013-10-27 DIAGNOSIS — X58XXXA Exposure to other specified factors, initial encounter: Secondary | ICD-10-CM | POA: Insufficient documentation

## 2013-10-27 DIAGNOSIS — S59909A Unspecified injury of unspecified elbow, initial encounter: Secondary | ICD-10-CM | POA: Insufficient documentation

## 2013-10-27 DIAGNOSIS — F17200 Nicotine dependence, unspecified, uncomplicated: Secondary | ICD-10-CM | POA: Insufficient documentation

## 2013-10-27 DIAGNOSIS — S60212A Contusion of left wrist, initial encounter: Secondary | ICD-10-CM

## 2013-10-27 DIAGNOSIS — IMO0002 Reserved for concepts with insufficient information to code with codable children: Secondary | ICD-10-CM | POA: Insufficient documentation

## 2013-10-27 DIAGNOSIS — Y929 Unspecified place or not applicable: Secondary | ICD-10-CM | POA: Insufficient documentation

## 2013-10-27 DIAGNOSIS — S6990XA Unspecified injury of unspecified wrist, hand and finger(s), initial encounter: Secondary | ICD-10-CM | POA: Insufficient documentation

## 2013-10-27 DIAGNOSIS — Z791 Long term (current) use of non-steroidal anti-inflammatories (NSAID): Secondary | ICD-10-CM | POA: Insufficient documentation

## 2013-10-27 MED ORDER — IBUPROFEN 600 MG PO TABS: 600.0000 mg | ORAL_TABLET | Freq: Three times a day (TID) | ORAL | Status: DC

## 2013-10-27 MED ORDER — IBUPROFEN 200 MG PO TABS: 600.0000 mg | ORAL_TABLET | Freq: Once | ORAL | Status: DC

## 2013-10-27 NOTE — ED Provider Notes (Addendum)
CSN: [id number]     Arrival date & time 10/27/13  8295 History   First MD Initiated Contact with Patient 10/27/13 0459     Chief Complaint  Patient presents with  . Back Pain   (Consider location/radiation/quality/duration/timing/severity/associated sxs/prior Treatment) HPI Comments: Patient comes into the emergency room accompanied by [employer].  Complaining of bilateral wrist discomfort, left greater than right.  He also states, that at the base of his left thumb.  There is some numbness.  He has full range of motion of his fingers, wrist, and elbows is no bruising.  Minor swelling  Patient is a 47 y.o. male presenting with back pain. The history is provided by the patient.  Back Pain Pain location: Bilateral wrists, left worse than right. Quality:  Aching Radiates to:  Does not radiate Pain severity:  Mild Onset quality:  Gradual Duration:  2 hours Timing:  Constant Progression:  Improving Chronicity:  New Relieved by:  None tried Worsened by:  Movement Ineffective treatments:  None tried Associated symptoms: numbness   Associated symptoms: no fever     History reviewed. No pertinent past medical history. No past surgical history on file. History reviewed. No pertinent family history. History  Substance Use Topics  . Smoking status: Current Every Day Smoker -- 0.10 packs/day for 25 years    Types: Cigarettes  . Smokeless tobacco: Current User  . Alcohol Use: Yes     Comment: 6 pack/week    Review of Systems  Constitutional: Negative for fever.  Musculoskeletal: Positive for back pain.  Neurological: Positive for numbness. Negative for dizziness.  All other systems reviewed and are negative.    Allergies  Codeine  Home Medications   Current Outpatient Rx  Name  Route  Sig  Dispense  Refill  . ibuprofen (ADVIL,MOTRIN) 600 MG tablet   Oral   Take 1 tablet (600 mg total) by mouth 3 (three) times daily.   30 tablet   0    BP 128/82  Pulse 103  Temp(Src) 97.8  F (36.6 C) (Oral)  Resp 16  SpO2 98% Physical Exam  ED Course  Procedures (including critical care time) Labs Review Labs Reviewed - No data to display Imaging Review No results found.  EKG Interpretation   None       MDM   1. Wrist contusion, left, initial encounter   2. Wrist pain, right        Arman Filter, NP 10/27/13 2102  Arman Filter, NP 11/03/13 [phone]

## 2013-10-27 NOTE — ED Provider Notes (Signed)
Medical screening examination/treatment/procedure(s) were performed by non-physician practitioner and as supervising physician I was immediately available for consultation/collaboration.    Sunnie Nielsen, MD 10/27/13 [phone]

## 2013-10-27 NOTE — ED Notes (Signed)
Bed: WJ19 Expected date:  Expected time:  Means of arrival:  Comments: EMS multi complaints

## 2013-10-27 NOTE — ED Notes (Signed)
Patient arrives via EMS, [facility] [employer] [facility] Patient with complaints of pain "everywhere"--back, wrists Patient ambulatory from ambulance bay to ED room 18 without difficulty or assistance--gait steady Patient appears in NAD

## 2013-11-03 NOTE — ED Provider Notes (Signed)
CSN: [id number]     Arrival date & time 10/27/13  4098 History   First MD Initiated Contact with Patient 10/27/13 0459     Chief Complaint  Patient presents with  . Back Pain   (Consider location/radiation/quality/duration/timing/severity/associated sxs/prior Treatment) HPI  History reviewed. No pertinent past medical history. No past surgical history on file. History reviewed. No pertinent family history. History  Substance Use Topics  . Smoking status: Current Every Day Smoker -- 0.10 packs/day for 25 years    Types: Cigarettes  . Smokeless tobacco: Current User  . Alcohol Use: Yes     Comment: 6 pack/week    Review of Systems  Allergies  Codeine  Home Medications   Current Outpatient Rx  Name  Route  Sig  Dispense  Refill  . ibuprofen (ADVIL,MOTRIN) 600 MG tablet   Oral   Take 1 tablet (600 mg total) by mouth 3 (three) times daily.   30 tablet   0    BP 128/82  Pulse 103  Temp(Src) 97.8 F (36.6 C) (Oral)  Resp 16  SpO2 98% Physical Exam  ED Course  Procedures (including critical care time) Labs Review Labs Reviewed - No data to display Imaging Review No results found.  EKG Interpretation   None       MDM   1. Wrist contusion, left, initial encounter   2. Wrist pain, right         Arman Filter, NP 11/03/13 [phone]

## 2013-11-10 NOTE — ED Provider Notes (Signed)
Medical screening examination/treatment/procedure(s) were performed by non-physician practitioner and as supervising physician I was immediately available for consultation/collaboration.    Sunnie Nielsen, MD 11/10/13 [phone]

## 2013-11-10 NOTE — ED Provider Notes (Signed)
Medical screening examination/treatment/procedure(s) were performed by non-physician practitioner and as supervising physician I was immediately available for consultation/collaboration.    Keyana Guevara, MD 11/10/13 2254 

## 2015-03-30 ENCOUNTER — Emergency Department (HOSPITAL_COMMUNITY): Payer: Self-pay

## 2015-03-30 ENCOUNTER — Encounter (HOSPITAL_COMMUNITY): Payer: Self-pay | Admitting: Emergency Medicine

## 2015-03-30 ENCOUNTER — Emergency Department (HOSPITAL_COMMUNITY)
Admission: EM | Admit: 2015-03-30 | Discharge: 2015-03-30 | Disposition: A | Discharge status: Home / Self Care | Payer: Self-pay

## 2015-03-30 DIAGNOSIS — R079 Chest pain, unspecified: Secondary | ICD-10-CM | POA: Insufficient documentation

## 2015-03-30 DIAGNOSIS — Z720 Tobacco use: Secondary | ICD-10-CM | POA: Insufficient documentation

## 2015-03-30 DIAGNOSIS — R61 Generalized hyperhidrosis: Secondary | ICD-10-CM | POA: Insufficient documentation

## 2015-03-30 DIAGNOSIS — I10 Essential (primary) hypertension: Secondary | ICD-10-CM | POA: Insufficient documentation

## 2015-03-30 DIAGNOSIS — M549 Dorsalgia, unspecified: Secondary | ICD-10-CM | POA: Insufficient documentation

## 2015-03-30 DIAGNOSIS — R200 Anesthesia of skin: Secondary | ICD-10-CM | POA: Insufficient documentation

## 2015-03-30 DIAGNOSIS — Z791 Long term (current) use of non-steroidal anti-inflammatories (NSAID): Secondary | ICD-10-CM | POA: Insufficient documentation

## 2015-03-30 DIAGNOSIS — R0602 Shortness of breath: Secondary | ICD-10-CM | POA: Insufficient documentation

## 2015-03-30 HISTORY — DX: Essential (primary) hypertension: I10

## 2015-03-30 LAB — CBC
HEMATOCRIT: 42.1 % (ref 39.0–52.0)
Hemoglobin: 14.7 g/dL (ref 13.0–17.0)
MCH: 29.5 pg (ref 26.0–34.0)
MCHC: 34.9 g/dL (ref 30.0–36.0)
MCV: 84.4 fL (ref 78.0–100.0)
PLATELETS: 261 10*3/uL (ref 150–400)
RBC: 4.99 MIL/uL (ref 4.22–5.81)
RDW: 12.9 % (ref 11.5–15.5)
WBC: 14.7 10*3/uL — AB (ref 4.0–10.5)

## 2015-03-30 LAB — BASIC METABOLIC PANEL
Anion gap: 11 (ref 5–15)
BUN: 10 mg/dL (ref 6–20)
CALCIUM: 9 mg/dL (ref 8.9–10.3)
CHLORIDE: 103 mmol/L (ref 101–111)
CO2: 23 mmol/L (ref 22–32)
Creatinine, Ser: 1.13 mg/dL (ref 0.61–1.24)
GFR calc non Af Amer: >60 mL/min (ref >60)
Glucose, Bld: 92 mg/dL (ref 65–99)
Potassium: 3.4 mmol/L — ABNORMAL LOW (ref 3.5–5.1)
SODIUM: 137 mmol/L (ref 135–145)

## 2015-03-30 LAB — I-STAT TROPONIN, ED
Troponin i, poc: 0.01 ng/mL (ref 0.00–0.08)
Troponin i, poc: 0 ng/mL (ref 0.00–0.08)

## 2015-03-30 LAB — BRAIN NATRIURETIC PEPTIDE: B NATRIURETIC PEPTIDE 5: 16 pg/mL (ref 0.0–100.0)

## 2015-03-30 MED ORDER — NITROGLYCERIN 0.4 MG SL SUBL
0.4000 mg | SUBLINGUAL_TABLET | SUBLINGUAL | Status: DC | PRN
  Administered 2015-03-30 (×2): 0.4 mg via SUBLINGUAL
  Filled 2015-03-30: qty 1

## 2015-03-30 MED ORDER — MORPHINE SULFATE 4 MG/ML IJ SOLN
4.0000 mg | Freq: Once | INTRAMUSCULAR | Status: AC
  Administered 2015-03-30: 4 mg via INTRAVENOUS
  Filled 2015-03-30: qty 1

## 2015-03-30 MED ORDER — KETOROLAC TROMETHAMINE 30 MG/ML IJ SOLN
30.0000 mg | Freq: Once | INTRAMUSCULAR | Status: AC
  Administered 2015-03-30: 30 mg via INTRAVENOUS
  Filled 2015-03-30: qty 1

## 2015-03-30 MED ORDER — ASPIRIN 81 MG PO CHEW
324.0000 mg | CHEWABLE_TABLET | Freq: Once | ORAL | Status: AC
  Administered 2015-03-30: 324 mg via ORAL
  Filled 2015-03-30: qty 4

## 2015-03-30 MED ORDER — ONDANSETRON HCL 4 MG/2ML IJ SOLN
4.0000 mg | Freq: Once | INTRAMUSCULAR | Status: AC
  Administered 2015-03-30: 4 mg via INTRAVENOUS
  Filled 2015-03-30: qty 2

## 2015-03-30 MED ORDER — SODIUM CHLORIDE 0.9 % IV BOLUS (SEPSIS)
1000.0000 mL | Freq: Once | INTRAVENOUS | Status: AC
  Administered 2015-03-30: 1000 mL via INTRAVENOUS

## 2015-03-30 NOTE — Discharge Instructions (Signed)
Chest Pain (Nonspecific) °It is often hard to give a specific diagnosis for the cause of chest pain. There is always a chance that your pain could be related to something serious, such as a heart attack or a blood clot in the lungs. You need to follow up with your health care provider for further evaluation. °CAUSES  °· Heartburn. °· Pneumonia or bronchitis. °· Anxiety or stress. °· Inflammation around your heart (pericarditis) or lung (pleuritis or pleurisy). °· A blood clot in the lung. °· A collapsed lung (pneumothorax). It can develop suddenly on its own (spontaneous pneumothorax) or from trauma to the chest. °· Shingles infection (herpes zoster virus). °The chest wall is composed of bones, muscles, and cartilage. Any of these can be the source of the pain. °· The bones can be bruised by injury. °· The muscles or cartilage can be strained by coughing or overwork. °· The cartilage can be affected by inflammation and become sore (costochondritis). °DIAGNOSIS  °Lab tests or other studies may be needed to find the cause of your pain. Your health care provider may have you take a test called an ambulatory electrocardiogram (ECG). An ECG records your heartbeat patterns over a 24-hour period. You may also have other tests, such as: °· Transthoracic echocardiogram (TTE). During echocardiography, sound waves are used to evaluate how blood flows through your heart. °· Transesophageal echocardiogram (TEE). °· Cardiac monitoring. This allows your health care provider to monitor your heart rate and rhythm in real time. °· Holter monitor. This is a portable device that records your heartbeat and can help diagnose heart arrhythmias. It allows your health care provider to track your heart activity for several days, if needed. °· Stress tests by exercise or by giving medicine that makes the heart beat faster. °TREATMENT  °· Treatment depends on what may be causing your chest pain. Treatment may include: °¨ Acid blockers for  heartburn. °¨ Anti-inflammatory medicine. °¨ Pain medicine for inflammatory conditions. °¨ Antibiotics if an infection is present. °· You may be advised to change lifestyle habits. This includes stopping smoking and avoiding alcohol, caffeine, and chocolate. °· You may be advised to keep your head raised (elevated) when sleeping. This reduces the chance of acid going backward from your stomach into your esophagus. °Most of the time, nonspecific chest pain will improve within 2-3 days with rest and mild pain medicine.  °HOME CARE INSTRUCTIONS  °· If antibiotics were prescribed, take them as directed. Finish them even if you start to feel better. °· For the next few days, avoid physical activities that bring on chest pain. Continue physical activities as directed. °· Do not use any tobacco products, including cigarettes, chewing tobacco, or electronic cigarettes. °· Avoid drinking alcohol. °· Only take medicine as directed by your health care provider. °· Follow your health care provider's suggestions for further testing if your chest pain does not go away. °· Keep any follow-up appointments you made. If you do not go to an appointment, you could develop lasting (chronic) problems with pain. If there is any problem keeping an appointment, call to reschedule. °SEEK MEDICAL CARE IF:  °· Your chest pain does not go away, even after treatment. °· You have a rash with blisters on your chest. °· You have a fever. °SEEK IMMEDIATE MEDICAL CARE IF:  °· You have increased chest pain or pain that spreads to your arm, neck, jaw, back, or abdomen. °· You have shortness of breath. °· You have an increasing cough, or you cough   up blood.  You have severe back or abdominal pain.  You feel nauseous or vomit.  You have severe weakness.  You faint.  You have chills. This is an emergency. Do not wait to see if the pain will go away. Get medical help at once. Call your local emergency services (911 in [place] Do not drive  yourself to the hospital. MAKE SURE YOU:   Understand these instructions.  Will watch your condition.  Will get help right away if you are not doing well or get worse. Document Released: 08/12/2005 Document Revised: 11/07/2013 Document Reviewed: 06/07/2008 [facility] Patient Information 2015 [employer], [place]. This information is not intended to replace advice given to you by your health care provider. Make sure you discuss any questions you have with your health care provider.  Please monitor for new or worsening signs or symptoms. Please return to emergency room immediately if any present. Please follow-up with cardiologist Dr. Sharyn LullHarwani for further evaluation and management

## 2015-03-30 NOTE — ED Provider Notes (Signed)
CSN: [id number]     Arrival date & time 03/30/15  [id number] History   First MD Initiated Contact with Patient 03/30/15 0400     Chief Complaint  Patient presents with  . Chest Pain  . Shortness of Breath    (Consider location/radiation/quality/duration/timing/severity/associated sxs/prior Treatment) HPI Comments: Patient is a 49 year old male with a history of hypertension who presents to the emergency department for further evaluation of chest pain. Patient states that pain awoke him from sleep at 3 AM. He describes the pain as a sharp pain in his left chest. He developed subsequent pain in his right shoulder blade as well. Symptoms associated with mild diaphoresis and shortness of breath as well as left arm numbness. Patient has a sporadic dry cough with deep breathing. Pain has been constant since onset. He has had similar symptoms intermittently over the past 2-3 weeks. Patient denies taking any medications for symptoms. He denies associated fever, syncope, leg swelling, nausea, and vomiting. Patient denies taking any medication for his hypertension. He is a smoker and smokes approximately 5 cigarettes per day. He has smoked for 30 years. Patient denies a history of hyperlipidemia, diabetes mellitus, ACS, and PE/DVT. No family history of DVT, PE, or ACS. Pain is currently rated 8/10. He reports doing heavy lifting at work on occasion.  Patient is a 49 y.o. male presenting with chest pain and shortness of breath. The history is provided by the patient. No language interpreter was used.  Chest Pain Associated symptoms: back pain, diaphoresis, numbness and shortness of breath   Associated symptoms: no nausea and not vomiting   Shortness of Breath Associated symptoms: chest pain and diaphoresis   Associated symptoms: no vomiting     Past Medical History  Diagnosis Date  . Hypertension    History reviewed. No pertinent past surgical history. History reviewed. No pertinent family history. History   Substance Use Topics  . Smoking status: Current Every Day Smoker -- 0.10 packs/day for 25 years    Types: Cigarettes  . Smokeless tobacco: Current User  . Alcohol Use: Yes     Comment: 6 pack/week    Review of Systems  Constitutional: Positive for diaphoresis.  Respiratory: Positive for shortness of breath.   Cardiovascular: Positive for chest pain. Negative for leg swelling.  Gastrointestinal: Negative for nausea and vomiting.  Musculoskeletal: Positive for back pain.  Neurological: Positive for numbness. Negative for syncope.  All other systems reviewed and are negative.   Allergies  Codeine  Home Medications   Prior to Admission medications   Medication Sig Start Date End Date Taking? Authorizing Provider  ibuprofen (ADVIL,MOTRIN) 600 MG tablet Take 1 tablet (600 mg total) by mouth 3 (three) times daily. Patient not taking: Reported on 03/30/2015 10/27/13   Earley FavorGail Schulz, NP   BP 137/89 mmHg  Pulse 96  Temp(Src) 97.9 F (36.6 C) (Oral)  Resp 18  SpO2 100%   Physical Exam  Constitutional: He is oriented to person, place, and time. He appears well-developed and well-nourished. No distress.  Nontoxic/nonseptic appearing  HENT:  Head: Normocephalic and atraumatic.  Eyes: Conjunctivae and EOM are normal. No scleral icterus.  Neck: Normal range of motion.  Cardiovascular: Normal rate, regular rhythm and intact distal pulses.   Pulmonary/Chest: Effort normal and breath sounds normal. No respiratory distress. He has no wheezes. He has no rales.  Respirations even and unlabored. Lungs clear.  Abdominal: Soft. He exhibits no distension. There is no tenderness. There is no guarding.  Soft, nontender  abdomen. No peritoneal signs.  Musculoskeletal: Normal range of motion.  Neurological: He is alert and oriented to person, place, and time. He exhibits normal muscle tone. Coordination normal.  GCS 15. Patient moving all extremities.  Skin: Skin is warm and dry. No rash noted. He  is not diaphoretic. No erythema. No pallor.  Psychiatric: He has a normal mood and affect. His behavior is normal.  Nursing note and vitals reviewed.   ED Course  Procedures (including critical care time) Labs Review Labs Reviewed  BASIC METABOLIC PANEL - Abnormal; Notable for the following:    Potassium 3.4 (*)    All other components within normal limits  CBC - Abnormal; Notable for the following:    WBC 14.7 (*)    All other components within normal limits  BRAIN NATRIURETIC PEPTIDE  I-STAT TROPOININ, ED    Imaging Review Dg Chest 2 View  03/30/2015   CLINICAL DATA:  Chest pain, left arm numbness, cough, congestion, and shortness of breath for 2 days.  EXAM: CHEST  2 VIEW  COMPARISON:  02/21/2013  FINDINGS: Normal heart size and pulmonary vascularity. No focal airspace disease or consolidation in the lungs. No blunting of costophrenic angles. No pneumothorax. Mediastinal contours appear intact. Degenerative changes in the spine.  IMPRESSION: No active cardiopulmonary disease.   Electronically Signed   By: Burman NievesWilliam  Stevens M.D.   On: 03/30/2015 04:24     EKG Interpretation   Date/Time:  Saturday Mar 30 2015 04:01:07 EDT Ventricular Rate:  105 PR Interval:  120 QRS Duration: 85 QT Interval:  343 QTC Calculation: 453 R Axis:   51 Text Interpretation:  Sinus tachycardia Consider left ventricular  hypertrophy  LVH Confirmed by [facility]  MD, Morene AntuAPRIL ([id number]) on  03/30/2015 4:09:36 AM      Heart Score 3 for age, risk factors of HTN and tobacco use, with moderate suspicion.  MDM   Final diagnoses:  Chest pain    49 year old African-American male with a history of hypertension presents to the emergency department today for chest pain with associated shortness of breath. He has had similar symptoms intermittently over the past week. He has a reassuring initial workup today with a negative troponin and nonischemic EKG. Chest x-ray shows no pneumothorax, pneumonia, or  mediastinal widening. Patient has a heart score of 3.  Patient pending delta troponin at 0730. Care to be assumed by Burna FortsJeff Hedges, PA-C at change of shift who will disposition appropriately.   Filed Vitals:   03/30/15 0515 03/30/15 0530 03/30/15 0545 03/30/15 0600  BP:  135/78  145/89  Pulse: 73 81 84 72  Temp:      TempSrc:      Resp:      SpO2: 100% 100% 100% 100%      Antony MaduraKelly Daniell Mancinas, PA-C 03/30/15 [phone]  April Palumbo, MD 03/30/15 [phone]

## 2015-03-30 NOTE — ED Notes (Signed)
Pt from home c/o left sided chest pain, shortness of breath and arm pain x 3 days.

## 2015-04-03 ENCOUNTER — Other Ambulatory Visit: Payer: Self-pay | Admitting: Cardiology

## 2015-04-03 DIAGNOSIS — R079 Chest pain, unspecified: Secondary | ICD-10-CM

## 2015-04-10 ENCOUNTER — Encounter (HOSPITAL_COMMUNITY): Admission: RE | Admit: 2015-04-10 | Payer: Self-pay | Source: Ambulatory Visit

## 2015-04-28 ENCOUNTER — Encounter (HOSPITAL_COMMUNITY): Payer: Self-pay

## 2015-04-28 ENCOUNTER — Emergency Department (HOSPITAL_COMMUNITY): Payer: Self-pay

## 2015-04-28 ENCOUNTER — Emergency Department (HOSPITAL_COMMUNITY)
Admission: EM | Admit: 2015-04-28 | Discharge: 2015-04-28 | Disposition: A | Discharge status: Home / Self Care | Payer: Self-pay | Attending: Emergency Medicine | Admitting: Emergency Medicine

## 2015-04-28 DIAGNOSIS — M546 Pain in thoracic spine: Secondary | ICD-10-CM | POA: Insufficient documentation

## 2015-04-28 DIAGNOSIS — Z720 Tobacco use: Secondary | ICD-10-CM | POA: Insufficient documentation

## 2015-04-28 DIAGNOSIS — I10 Essential (primary) hypertension: Secondary | ICD-10-CM | POA: Insufficient documentation

## 2015-04-28 DIAGNOSIS — R079 Chest pain, unspecified: Secondary | ICD-10-CM | POA: Insufficient documentation

## 2015-04-28 LAB — CBC
HEMATOCRIT: 43.2 % (ref 39.0–52.0)
Hemoglobin: 14.6 g/dL (ref 13.0–17.0)
MCH: 29.3 pg (ref 26.0–34.0)
MCHC: 33.8 g/dL (ref 30.0–36.0)
MCV: 86.7 fL (ref 78.0–100.0)
Platelets: 268 10*3/uL (ref 150–400)
RBC: 4.98 MIL/uL (ref 4.22–5.81)
RDW: 13.1 % (ref 11.5–15.5)
WBC: 9.6 10*3/uL (ref 4.0–10.5)

## 2015-04-28 LAB — I-STAT CHEM 8, ED
BUN: 12 mg/dL (ref 6–20)
CREATININE: 1.2 mg/dL (ref 0.61–1.24)
Calcium, Ion: 1.18 mmol/L (ref 1.12–1.23)
Chloride: 104 mmol/L (ref 101–111)
Glucose, Bld: 113 mg/dL — ABNORMAL HIGH (ref 65–99)
HEMATOCRIT: 47 % (ref 39.0–52.0)
Hemoglobin: 16 g/dL (ref 13.0–17.0)
Potassium: 3.5 mmol/L (ref 3.5–5.1)
SODIUM: 140 mmol/L (ref 135–145)
TCO2: 22 mmol/L (ref 0–100)

## 2015-04-28 LAB — I-STAT TROPONIN, ED: TROPONIN I, POC: 0 ng/mL (ref 0.00–0.08)

## 2015-04-28 NOTE — ED Notes (Signed)
Pt complains of left sided chest pain that radiates to his back for two days, he also states that he's short of breath

## 2015-04-28 NOTE — ED Notes (Signed)
Pt alert and oriented upon DC. He was advised to follow cardiology in 2 days.

## 2015-04-28 NOTE — Discharge Instructions (Signed)
Please call your doctor for a followup appointment within 24-48 hours. When you talk to your doctor please let them know that you were seen in the emergency department and have them acquire all of your records so that they can discuss the findings with you and formulate a treatment plan to fully care for your new and ongoing problems. ° °

## 2015-04-28 NOTE — ED Notes (Signed)
Pt to xray

## 2015-04-28 NOTE — ED Provider Notes (Signed)
CSN: [id number]     Arrival date & time 04/28/15  2131 History   First MD Initiated Contact with Patient 04/28/15 2144     Chief Complaint  Patient presents with  . Chest Pain     (Consider location/radiation/quality/duration/timing/severity/associated sxs/prior Treatment) HPI Comments: 49 year old male, history of hypertension, smokes cigarettes, has had intermittent chest pain over the last couple of years occasionally, the episodes are few and far between however over the last 3 days he has had ongoing chest pain. For 48 hours she has had persistent ongoing left-sided pain in his left upper chest and his left upper back over the shoulder blade. This is worse with sitting up, better with laying down, not exertional, somewhat worse with taking a deep breath. No fevers, coughs, chills, no swelling of the legs, no risk factors for pulmonary embolism, heart score is 2. Receiving points for his age and his risk factors including hypertension and cigarette smoking. He reports following up with a heart doctor approximately one month ago. He had to miss the stress test which was scheduled because of work, he has not called to reset up the cardiology appointment.  Patient is a 49 y.o. male presenting with chest pain. The history is provided by the patient.  Chest Pain   Past Medical History  Diagnosis Date  . Hypertension    History reviewed. No pertinent past surgical history. History reviewed. No pertinent family history. History  Substance Use Topics  . Smoking status: Current Every Day Smoker -- 0.10 packs/day for 25 years    Types: Cigarettes  . Smokeless tobacco: Current User  . Alcohol Use: Yes     Comment: 6 pack/week    Review of Systems  Cardiovascular: Positive for chest pain.  All other systems reviewed and are negative.     Allergies  Codeine  Home Medications   Prior to Admission medications   Medication Sig Start Date End Date Taking? Authorizing Provider   ibuprofen (ADVIL,MOTRIN) 600 MG tablet Take 1 tablet (600 mg total) by mouth 3 (three) times daily. Patient not taking: Reported on 03/30/2015 10/27/13   Earley Favor, NP   BP 125/76 mmHg  Pulse 81  Temp(Src) 97.9 F (36.6 C) (Oral)  Resp 15  Ht 5\' 10"  (1.778 m)  Wt 195 lb (88.451 kg)  BMI 27.98 kg/m2  SpO2 94% Physical Exam  Constitutional: He appears well-developed and well-nourished. No distress.  HENT:  Head: Normocephalic and atraumatic.  Mouth/Throat: Oropharynx is clear and moist. No oropharyngeal exudate.  Eyes: Conjunctivae and EOM are normal. Pupils are equal, round, and reactive to light. Right eye exhibits no discharge. Left eye exhibits no discharge. No scleral icterus.  Neck: Normal range of motion. Neck supple. No JVD present. No thyromegaly present.  Cardiovascular: Normal rate, regular rhythm, normal heart sounds and intact distal pulses.  Exam reveals no gallop and no friction rub.   No murmur heard. Pulmonary/Chest: Effort normal and breath sounds normal. No respiratory distress. He has no wheezes. He has no rales.  Abdominal: Soft. Bowel sounds are normal. He exhibits no distension and no mass. There is no tenderness.  Musculoskeletal: Normal range of motion. He exhibits no edema or tenderness.  Lymphadenopathy:    He has no cervical adenopathy.  Neurological: He is alert. Coordination normal.  Skin: Skin is warm and dry. No rash noted. No erythema.  Psychiatric: He has a normal mood and affect. His behavior is normal.  Nursing note and vitals reviewed.   ED Course  Procedures (including critical care time) Labs Review Labs Reviewed  I-STAT CHEM 8, ED - Abnormal; Notable for the following:    Glucose, Bld 113 (*)    All other components within normal limits  CBC  I-STAT TROPOININ, ED    Imaging Review Dg Chest 2 View  04/28/2015   CLINICAL DATA:  Acute onset of left upper quadrant abdominal pain and chest pain. Left arm tightness. Initial encounter.   EXAM: CHEST  2 VIEW  COMPARISON:  Chest radiograph performed 03/30/2015  FINDINGS: The lungs are well-aerated and clear. There is no evidence of focal opacification, pleural effusion or pneumothorax. Bilateral nipple shadows are seen.  The heart is normal in size; the mediastinal contour is within normal limits. No acute osseous abnormalities are seen.  IMPRESSION: No acute cardiopulmonary process seen.   Electronically Signed   By: Roanna Raider M.D.   On: 04/28/2015 22:31     EKG Interpretation   Date/Time:  Sunday April 28 2015 21:35:19 EDT Ventricular Rate:  95 PR Interval:  112 QRS Duration: 79 QT Interval:  330 QTC Calculation: 415 R Axis:   61 Text Interpretation:  Sinus rhythm Borderline short PR interval since last  tracing no significant change Confirmed by Dawnya Grams  MD, Michaela Broski ([id number]) on  04/28/2015 9:44:13 PM      MDM   Final diagnoses:  Chest pain    The EKG is unremarkable and more specifically unchanged and after 48 hours of ongoing pain I would make this less likely to be an acute coronary syndrome. We'll obtain a troponin to verify, the patient will likely need to follow back up with the cardiologist and reschedule the stress test. He is willing to do this, his symptoms are mild at this time, chest x-ray pending  Labs normal- pt informed - needs f/u with cardiology to reinitiate stress test w/u - he is agreeable - informed of results and he is amenable to the plan.     Eber Hong, MD 04/28/15 2251

## 2015-07-08 ENCOUNTER — Ambulatory Visit (HOSPITAL_COMMUNITY): Payer: Self-pay

## 2015-07-08 ENCOUNTER — Other Ambulatory Visit (HOSPITAL_COMMUNITY): Payer: Self-pay

## 2015-09-14 ENCOUNTER — Emergency Department (HOSPITAL_COMMUNITY): Payer: Self-pay

## 2015-09-14 ENCOUNTER — Encounter (HOSPITAL_COMMUNITY): Payer: Self-pay | Admitting: *Deleted

## 2015-09-14 ENCOUNTER — Emergency Department (HOSPITAL_COMMUNITY)
Admission: EM | Admit: 2015-09-14 | Discharge: 2015-09-14 | Disposition: A | Discharge status: Home / Self Care | Payer: Self-pay | Attending: Physician Assistant | Admitting: Physician Assistant

## 2015-09-14 DIAGNOSIS — M549 Dorsalgia, unspecified: Secondary | ICD-10-CM | POA: Insufficient documentation

## 2015-09-14 DIAGNOSIS — R0789 Other chest pain: Secondary | ICD-10-CM | POA: Insufficient documentation

## 2015-09-14 DIAGNOSIS — I10 Essential (primary) hypertension: Secondary | ICD-10-CM | POA: Insufficient documentation

## 2015-09-14 DIAGNOSIS — Z720 Tobacco use: Secondary | ICD-10-CM | POA: Insufficient documentation

## 2015-09-14 DIAGNOSIS — R079 Chest pain, unspecified: Secondary | ICD-10-CM

## 2015-09-14 LAB — BASIC METABOLIC PANEL
Anion gap: 8 (ref 5–15)
BUN: 9 mg/dL (ref 6–20)
CALCIUM: 8.8 mg/dL — AB (ref 8.9–10.3)
CO2: 28 mmol/L (ref 22–32)
Chloride: 103 mmol/L (ref 101–111)
Creatinine, Ser: 1.22 mg/dL (ref 0.61–1.24)
GFR calc Af Amer: >60 mL/min (ref >60)
GFR calc non Af Amer: >60 mL/min (ref >60)
GLUCOSE: 95 mg/dL (ref 65–99)
Potassium: 4 mmol/L (ref 3.5–5.1)
SODIUM: 139 mmol/L (ref 135–145)

## 2015-09-14 LAB — LIPASE, BLOOD: LIPASE: 23 U/L (ref 11–51)

## 2015-09-14 LAB — CBC
HCT: 45.4 % (ref 39.0–52.0)
HEMOGLOBIN: 15.9 g/dL (ref 13.0–17.0)
MCH: 30.4 pg (ref 26.0–34.0)
MCHC: 35 g/dL (ref 30.0–36.0)
MCV: 86.8 fL (ref 78.0–100.0)
Platelets: 297 10*3/uL (ref 150–400)
RBC: 5.23 MIL/uL (ref 4.22–5.81)
RDW: 12.5 % (ref 11.5–15.5)
WBC: 9.7 10*3/uL (ref 4.0–10.5)

## 2015-09-14 LAB — I-STAT TROPONIN, ED: TROPONIN I, POC: 0 ng/mL (ref 0.00–0.08)

## 2015-09-14 MED ORDER — IBUPROFEN 800 MG PO TABS: 800.0000 mg | ORAL_TABLET | Freq: Three times a day (TID) | ORAL | Status: DC

## 2015-09-14 MED ORDER — SODIUM CHLORIDE 0.9 % IV BOLUS (SEPSIS)
1000.0000 mL | Freq: Once | INTRAVENOUS | Status: AC
Start: 2015-09-14 — End: 2015-09-14
  Administered 2015-09-14: 1000 mL via INTRAVENOUS

## 2015-09-14 MED ORDER — CYCLOBENZAPRINE HCL 10 MG PO TABS
10.0000 mg | ORAL_TABLET | Freq: Once | ORAL | Status: AC
  Administered 2015-09-14: 10 mg via ORAL
  Filled 2015-09-14: qty 1

## 2015-09-14 MED ORDER — CYCLOBENZAPRINE HCL 10 MG PO TABS: 10.0000 mg | ORAL_TABLET | Freq: Two times a day (BID) | ORAL | Status: DC | PRN

## 2015-09-14 MED ORDER — IBUPROFEN 800 MG PO TABS
800.0000 mg | ORAL_TABLET | Freq: Once | ORAL | Status: AC
  Administered 2015-09-14: 800 mg via ORAL
  Filled 2015-09-14: qty 1

## 2015-09-14 NOTE — ED Provider Notes (Signed)
CSN: [id number]     Arrival date & time 09/14/15  0459 History   First MD Initiated Contact with Patient 09/14/15 [phone]     Chief Complaint  Patient presents with  . Chest Pain     (Consider location/radiation/quality/duration/timing/severity/associated sxs/prior Treatment) HPI   Patient is a 49 year old male smoker presenting today with chest pain since yesterday. 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 Patient states that he had pain in his anterior chest and into his back. Its positional so when he bends pick something up or rotates to the right the sharp pain is made worse.  Patient has hypertension history of hypertension. Not on any medications. No history of diabetes. No family history of early, cardiac death.  No preceding viral illness.  No associated nausea vomiting diarrhea or abdominal pain.  Past Medical History  Diagnosis Date  . Hypertension    History reviewed. No pertinent past surgical history. No family history on file. Social History  Substance Use Topics  . Smoking status: Current Every Day Smoker -- 0.10 packs/day for 25 years    Types: Cigarettes  . Smokeless tobacco: Current User  . Alcohol Use: Yes     Comment: 6 pack/week    Review of Systems  Constitutional: Negative for fever and activity change.  HENT: Negative for drooling.   Eyes: Negative for discharge and redness.  Respiratory: Negative for cough and shortness of breath.   Cardiovascular: Positive for chest pain.  Gastrointestinal: Negative for abdominal pain.  Musculoskeletal: Positive for back pain. Negative for arthralgias.  Allergic/Immunologic: Negative for immunocompromised state.  Neurological: Negative for headaches.  Psychiatric/Behavioral: Negative for behavioral problems.  All other systems reviewed and are negative.     Allergies  Codeine  Home Medications   Prior to Admission medications   Medication Sig Start Date End Date Taking? Authorizing Provider   ibuprofen (ADVIL,MOTRIN) 600 MG tablet Take 1 tablet (600 mg total) by mouth 3 (three) times daily. Patient not taking: Reported on 03/30/2015 10/27/13   Earley FavorGail Schulz, NP   BP 132/88 mmHg  Pulse 126  Temp(Src) 97.7 F (36.5 C) (Oral)  Ht 5\' 10"  (1.778 m)  Wt 198 lb (89.812 kg)  BMI 28.41 kg/m2  SpO2 100% Physical Exam  Constitutional: He is oriented to person, place, and time. He appears well-nourished.  HENT:  Head: Normocephalic.  Mouth/Throat: Oropharynx is clear and moist.  Eyes: Conjunctivae are normal.  Neck: No tracheal deviation present.  Cardiovascular: Normal rate.   Pulmonary/Chest: Effort normal. No stridor. No respiratory distress. He exhibits tenderness.  Tenderness to palpation anterior chest wall and back.  Abdominal: Soft. There is no tenderness. There is no guarding.  Musculoskeletal: Normal range of motion. He exhibits no edema.  Neurological: He is oriented to person, place, and time. No cranial nerve deficit.  Skin: Skin is warm and dry. No rash noted. He is not diaphoretic.  Psychiatric: He has a normal mood and affect. His behavior is normal.  Nursing note and vitals reviewed.   ED Course  Procedures (including critical care time) Labs Review Labs Reviewed  CBC  BASIC METABOLIC PANEL  LIPASE, BLOOD  I-STAT TROPOININ, ED    Imaging Review No results found. I have personally reviewed and evaluated these images and lab results as part of my medical decision-making.   EKG Interpretation   Date/Time:  Saturday September 14 2015 05:09:36 EDT Ventricular Rate:  125 PR Interval:  120 QRS Duration: 79 QT Interval:  330 QTC Calculation: 476 R  Axis:   67 Text Interpretation:  Sinus tachycardia Probable left ventricular  hypertrophy ST elev, probable normal early repol pattern Borderline  prolonged QT interval Since last tracing rate faster sloping consistent  with early repol pattern.  Confirmed by Kandis Mannan ([id number]) on  09/14/2015 5:12:47  AM      MDM   Final diagnoses:  None    Patient is a very friendly 49 year old male with history of hypertension and smoking presenting today with chest pain since yesterday. Given the atypical length of chest pain think a single troponin will suffice to rule out ischemic cardiac disease. EKG shows likely benign early re-pole. Considered pericarditis in this gentleman however there is no diffuse ST elevation or PR depression that I can see in the EKG. We will get labs including lipase to rule out pancreatitis. If labs are normal including troponin and chest x-ray is normal, we will have successfully excluded dangerous pathology. Will then treat for musculoskeletal etiology of this pain.    Nocole Zammit Randall An, MD 09/14/15 [phone]

## 2015-09-14 NOTE — Discharge Instructions (Signed)

## 2015-09-14 NOTE — ED Notes (Signed)
Pt states that he began to have pain to his back yesterday that has progressed to chest pain and shortness of breath; pt states that it like something is stabbing him and going from chest through to back; pt states that he has been seen a few times for feeling short of breath

## 2016-08-17 ENCOUNTER — Emergency Department (HOSPITAL_COMMUNITY): Payer: Self-pay

## 2016-08-17 ENCOUNTER — Emergency Department (HOSPITAL_COMMUNITY)
Admission: EM | Admit: 2016-08-17 | Discharge: 2016-08-17 | Disposition: A | Discharge status: Home / Self Care | Payer: Self-pay | Attending: Emergency Medicine | Admitting: Emergency Medicine

## 2016-08-17 ENCOUNTER — Encounter (HOSPITAL_COMMUNITY): Payer: Self-pay | Admitting: Emergency Medicine

## 2016-08-17 DIAGNOSIS — I10 Essential (primary) hypertension: Secondary | ICD-10-CM | POA: Insufficient documentation

## 2016-08-17 DIAGNOSIS — R0789 Other chest pain: Secondary | ICD-10-CM | POA: Insufficient documentation

## 2016-08-17 DIAGNOSIS — F17210 Nicotine dependence, cigarettes, uncomplicated: Secondary | ICD-10-CM | POA: Insufficient documentation

## 2016-08-17 LAB — I-STAT TROPONIN, ED: Troponin i, poc: 0.01 ng/mL (ref 0.00–0.08)

## 2016-08-17 LAB — BASIC METABOLIC PANEL
Anion gap: 7 (ref 5–15)
BUN: 16 mg/dL (ref 6–20)
CHLORIDE: 107 mmol/L (ref 101–111)
CO2: 26 mmol/L (ref 22–32)
CREATININE: 1.05 mg/dL (ref 0.61–1.24)
Calcium: 9 mg/dL (ref 8.9–10.3)
GFR calc Af Amer: >60 mL/min (ref >60)
GFR calc non Af Amer: >60 mL/min (ref >60)
GLUCOSE: 137 mg/dL — AB (ref 65–99)
Potassium: 3.8 mmol/L (ref 3.5–5.1)
SODIUM: 140 mmol/L (ref 135–145)

## 2016-08-17 LAB — CBC
HCT: 40.9 % (ref 39.0–52.0)
Hemoglobin: 14.1 g/dL (ref 13.0–17.0)
MCH: 30.1 pg (ref 26.0–34.0)
MCHC: 34.5 g/dL (ref 30.0–36.0)
MCV: 87.4 fL (ref 78.0–100.0)
PLATELETS: 265 10*3/uL (ref 150–400)
RBC: 4.68 MIL/uL (ref 4.22–5.81)
RDW: 13.2 % (ref 11.5–15.5)
WBC: 11.2 10*3/uL — AB (ref 4.0–10.5)

## 2016-08-17 MED ORDER — IBUPROFEN 200 MG PO TABS
400.0000 mg | ORAL_TABLET | Freq: Once | ORAL | Status: AC
  Administered 2016-08-17: 400 mg via ORAL
  Filled 2016-08-17: qty 2

## 2016-08-17 NOTE — Discharge Instructions (Signed)
It was our pleasure to provide your ER care today - we hope that you feel better.  Take motrin or aleve as need for pain.  Follow up with primary care doctor in the coming week if symptoms fail to improve/resolve.  Return to ER if worse, new symptoms, fevers, trouble breathing, persistent or recurrent chest pain, other concern.

## 2016-08-17 NOTE — ED Triage Notes (Signed)
Patient c/o right sided chest pain x 2 weeks.  Pt states pain is worse with movements esp bending over.  Patient is smoker and PMH of HTN

## 2016-08-17 NOTE — ED Provider Notes (Signed)
[facility]-EMERGENCY DEPT Provider Note   CSN: [id number] Arrival date & time: 08/17/16  0813     History   Chief Complaint Chief Complaint  Patient presents with  . Chest Pain    HPI Joseph [name] is a 50 y.o. male.  Patient c/o right sided chest pain for the past 2 weeks. Symptoms constant, persistent, right anterior chest, non radiating. Symptoms present at rest. No relation to exertion or activity. No associated sob, nv or diaphoresis. Pain is not pleuritic. Denies hx cad or fam hx cad. Denies chest wall injury or strain. Denies cough or uri c/o. No fever or chills. +smoker. Denies leg pain or swelling. No recent surgery, immobility, trauma or travel. No hx dvt or pe. Pain is worse w palpation and certain movements.    The history is provided by the patient.  Chest Pain   Pertinent negatives include no abdominal pain, no back pain, no fever, no headaches, no shortness of breath and no vomiting.    Past Medical History:  Diagnosis Date  . Hypertension     There are no active problems to display for this patient.   History reviewed. No pertinent surgical history.     Home Medications    Prior to Admission medications   Medication Sig Start Date End Date Taking? Authorizing Provider  cyclobenzaprine (FLEXERIL) 10 MG tablet Take 1 tablet (10 mg total) by mouth 2 (two) times daily as needed for muscle spasms. 09/14/15   Courteney Lyn Mackuen, MD  ibuprofen (ADVIL,MOTRIN) 800 MG tablet Take 1 tablet (800 mg total) by mouth 3 (three) times daily. 09/14/15   Courteney Lyn Mackuen, MD    Family History No family history on file.  Social History Social History  Substance Use Topics  . Smoking status: Current Every Day Smoker    Packs/day: 0.10    Years: 25.00    Types: Cigarettes  . Smokeless tobacco: Current User  . Alcohol use Yes     Comment: 6 pack/week     Allergies   Codeine   Review of Systems Review of Systems  Constitutional: Negative for fever.    HENT: Negative for sore throat.   Eyes: Negative for redness.  Respiratory: Negative for shortness of breath.   Cardiovascular: Positive for chest pain. Negative for leg swelling.  Gastrointestinal: Negative for abdominal pain and vomiting.  Genitourinary: Negative for flank pain.  Musculoskeletal: Negative for back pain and neck pain.  Skin: Negative for rash.  Neurological: Negative for headaches.  Hematological: Does not bruise/bleed easily.  Psychiatric/Behavioral: Negative for confusion.     Physical Exam Updated Vital Signs Ht 5\' 10"  (1.778 m)   Wt 93 kg   BMI 29.41 kg/m   Physical Exam  Constitutional: He appears well-developed and well-nourished. No distress.  HENT:  Head: Atraumatic.  Eyes: Conjunctivae are normal.  Neck: Neck supple. No tracheal deviation present.  Cardiovascular: Normal rate, regular rhythm, normal heart sounds and intact distal pulses.  Exam reveals no gallop and no friction rub.   No murmur heard. Pulmonary/Chest: Effort normal. No accessory muscle usage. No respiratory distress. He exhibits tenderness.  Right chest wall tenderness reproducing symptoms. No mass felt.   Abdominal: Soft. He exhibits no distension. There is no tenderness.  Musculoskeletal: He exhibits no edema or tenderness.  Neurological: He is alert.  Skin: Skin is warm and dry. No rash noted. He is not diaphoretic.  Psychiatric: He has a normal mood and affect.  Nursing note and vitals reviewed.  ED Treatments / Results  Labs (all labs ordered are listed, but only abnormal results are displayed) Results for orders placed or performed during the hospital encounter of 08/17/16  Basic metabolic panel  Result Value Ref Range   Sodium 140 135 - 145 mmol/L   Potassium 3.8 3.5 - 5.1 mmol/L   Chloride 107 101 - 111 mmol/L   CO2 26 22 - 32 mmol/L   Glucose, Bld 137 (H) 65 - 99 mg/dL   BUN 16 6 - 20 mg/dL   Creatinine, Ser 0.451.05 0.61 - 1.24 mg/dL   Calcium 9.0 8.9 - 40.910.3  mg/dL   GFR calc non Af Amer >60 >60 mL/min   GFR calc Af Amer >60 >60 mL/min   Anion gap 7 5 - 15  CBC  Result Value Ref Range   WBC 11.2 (H) 4.0 - 10.5 K/uL   RBC 4.68 4.22 - 5.81 MIL/uL   Hemoglobin 14.1 13.0 - 17.0 g/dL   HCT 81.140.9 91.439.0 - 78.252.0 %   MCV 87.4 78.0 - 100.0 fL   MCH 30.1 26.0 - 34.0 pg   MCHC 34.5 30.0 - 36.0 g/dL   RDW 95.613.2 21.311.5 - 08.615.5 %   Platelets 265 150 - 400 K/uL  I-stat troponin, ED  Result Value Ref Range   Troponin i, poc 0.01 0.00 - 0.08 ng/mL   Comment 3           Dg Chest 2 View  Result Date: 08/17/2016 CLINICAL DATA:  Chest pain. EXAM: CHEST  2 VIEW COMPARISON:  09/14/2015 . FINDINGS: Mediastinum hilar structures normal. Lungs are clear. Heart size normal. No pleural effusion or pneumothorax. No acute bony abnormality. Degenerative changes thoracic spine IMPRESSION: No acute cardiopulmonary disease. Electronically Signed   By: Maisie Fushomas  Register   On: 08/17/2016 09:20    EKG  EKG Interpretation  Date/Time:  Monday August 17 2016 08:20:36 EDT Ventricular Rate:  90 PR Interval:    QRS Duration: 88 QT Interval:  356 QTC Calculation: 436 R Axis:   56 Text Interpretation:  Sinus rhythm Borderline short PR interval Consider left ventricular hypertrophy Non-specific ST-t changes Confirmed by Denton LankSTEINL  MD, Caryn BeeKEVIN ([id number]) on 08/17/2016 8:27:34 AM       Radiology Dg Chest 2 View  Result Date: 08/17/2016 CLINICAL DATA:  Chest pain. EXAM: CHEST  2 VIEW COMPARISON:  09/14/2015 . FINDINGS: Mediastinum hilar structures normal. Lungs are clear. Heart size normal. No pleural effusion or pneumothorax. No acute bony abnormality. Degenerative changes thoracic spine IMPRESSION: No acute cardiopulmonary disease. Electronically Signed   By: Maisie Fushomas  Register   On: 08/17/2016 09:20    Procedures Procedures (including critical care time)  Medications Ordered in ED Medications - No data to display   Initial Impression / Assessment and Plan / ED Course  I have  reviewed the triage vital signs and the nursing notes.  Pertinent labs & imaging results that were available during my care of the patient were reviewed by me and considered in my medical decision making (see chart for details).  Clinical Course    Labs and cxr ordered by staff.  Motrin po.  Reviewed nursing notes and prior charts for additional history.   After symptoms present/constant for > week, trop negative.   Exam appears most c/w musculoskeletal chest pain.   Patient currently appears stable for d/c.     Final Clinical Impressions(s) / ED Diagnoses   Final diagnoses:  None    New Prescriptions New Prescriptions   No  medications on file     Cathren Laine, MD 08/17/16 [phone]

## 2016-10-12 ENCOUNTER — Encounter (HOSPITAL_COMMUNITY): Payer: Self-pay | Admitting: *Deleted

## 2016-10-12 ENCOUNTER — Emergency Department (HOSPITAL_COMMUNITY)
Admission: EM | Admit: 2016-10-12 | Discharge: 2016-10-12 | Disposition: A | Discharge status: Home / Self Care | Payer: Self-pay | Attending: Emergency Medicine | Admitting: Emergency Medicine

## 2016-10-12 ENCOUNTER — Emergency Department (HOSPITAL_COMMUNITY): Payer: Self-pay

## 2016-10-12 DIAGNOSIS — R1084 Generalized abdominal pain: Secondary | ICD-10-CM

## 2016-10-12 DIAGNOSIS — I10 Essential (primary) hypertension: Secondary | ICD-10-CM | POA: Insufficient documentation

## 2016-10-12 DIAGNOSIS — B349 Viral infection, unspecified: Secondary | ICD-10-CM

## 2016-10-12 DIAGNOSIS — F17210 Nicotine dependence, cigarettes, uncomplicated: Secondary | ICD-10-CM | POA: Insufficient documentation

## 2016-10-12 LAB — URINALYSIS, ROUTINE W REFLEX MICROSCOPIC
Bilirubin Urine: NEGATIVE
GLUCOSE, UA: NEGATIVE mg/dL
Hgb urine dipstick: NEGATIVE
KETONES UR: NEGATIVE mg/dL
LEUKOCYTES UA: NEGATIVE
NITRITE: NEGATIVE
PH: 7 (ref 5.0–8.0)
Protein, ur: NEGATIVE mg/dL
SPECIFIC GRAVITY, URINE: 1.011 (ref 1.005–1.030)

## 2016-10-12 LAB — COMPREHENSIVE METABOLIC PANEL
ALBUMIN: 4.2 g/dL (ref 3.5–5.0)
ALK PHOS: 56 U/L (ref 38–126)
ALT: 17 U/L (ref 17–63)
ANION GAP: 7 (ref 5–15)
AST: 21 U/L (ref 15–41)
BUN: 10 mg/dL (ref 6–20)
CHLORIDE: 106 mmol/L (ref 101–111)
CO2: 27 mmol/L (ref 22–32)
Calcium: 8.9 mg/dL (ref 8.9–10.3)
Creatinine, Ser: 1.06 mg/dL (ref 0.61–1.24)
GFR calc non Af Amer: >60 mL/min (ref >60)
GLUCOSE: 91 mg/dL (ref 65–99)
POTASSIUM: 4 mmol/L (ref 3.5–5.1)
SODIUM: 140 mmol/L (ref 135–145)
Total Bilirubin: 0.9 mg/dL (ref 0.3–1.2)
Total Protein: 7.2 g/dL (ref 6.5–8.1)

## 2016-10-12 LAB — CBC
HEMATOCRIT: 40.5 % (ref 39.0–52.0)
HEMOGLOBIN: 13.8 g/dL (ref 13.0–17.0)
MCH: 30.1 pg (ref 26.0–34.0)
MCHC: 34.1 g/dL (ref 30.0–36.0)
MCV: 88.2 fL (ref 78.0–100.0)
Platelets: 237 10*3/uL (ref 150–400)
RBC: 4.59 MIL/uL (ref 4.22–5.81)
RDW: 13.1 % (ref 11.5–15.5)
WBC: 8.9 10*3/uL (ref 4.0–10.5)

## 2016-10-12 LAB — LIPASE, BLOOD: LIPASE: 20 U/L (ref 11–51)

## 2016-10-12 MED ORDER — DICYCLOMINE HCL 10 MG PO CAPS
10.0000 mg | ORAL_CAPSULE | Freq: Once | ORAL | Status: AC
  Administered 2016-10-12: 10 mg via ORAL
  Filled 2016-10-12: qty 1

## 2016-10-12 MED ORDER — DICYCLOMINE HCL 20 MG PO TABS: 20.0000 mg | ORAL_TABLET | Freq: Two times a day (BID) | ORAL | 0 refills | Status: DC

## 2016-10-12 MED ORDER — SODIUM CHLORIDE 0.9 % IV BOLUS (SEPSIS)
1000.0000 mL | Freq: Once | INTRAVENOUS | Status: AC
  Administered 2016-10-12: 1000 mL via INTRAVENOUS

## 2016-10-12 MED ORDER — ONDANSETRON 4 MG PO TBDP: ORAL_TABLET | ORAL | 0 refills | Status: DC

## 2016-10-12 MED ORDER — FAMOTIDINE IN NACL 20-0.9 MG/50ML-% IV SOLN
20.0000 mg | Freq: Once | INTRAVENOUS | Status: AC
  Administered 2016-10-12: 20 mg via INTRAVENOUS
  Filled 2016-10-12: qty 50

## 2016-10-12 MED ORDER — ONDANSETRON HCL 4 MG/2ML IJ SOLN
4.0000 mg | Freq: Once | INTRAMUSCULAR | Status: AC | PRN
  Administered 2016-10-12: 4 mg via INTRAVENOUS
  Filled 2016-10-12: qty 2

## 2016-10-12 NOTE — ED Triage Notes (Signed)
Pt c/o generalized abd pain that began 3 days ago; pt states that he has vomited x 2 this morning; pt states that he had numerous episodes of diarrhea yesterday and has had 2 this morning; pt c/o nasal congestion and generalized body aches; pt with NP cough in triage

## 2016-10-12 NOTE — ED Provider Notes (Signed)
[facility]-EMERGENCY DEPT Provider Note   CSN: [id number] Arrival date & time: 10/12/16  [id number]     History   Chief Complaint Chief Complaint  Patient presents with  . Abdominal Pain    HPI Joseph [name] is a 50 y.o. male.  Patient is a 50 year old male with a history of hypertension who presents with URI symptoms and abdominal pain. He states that 2 days ago he started having some runny nose congestion and a sore throat. The next day started having nausea vomiting and diarrhea. His emesis is nonbloody and nonbilious. His diarrhea is watery and nonbloody. He has some crampy pain across his abdomen which comes and goes. He denies any known fevers. He has a cough which is productive of yellow sputum. He has myalgias. There is no shortness of breath. Please been using over-the-counter medications without improvement in symptoms.      Past Medical History:  Diagnosis Date  . Hypertension     There are no active problems to display for this patient.   History reviewed. No pertinent surgical history.     Home Medications    Prior to Admission medications   Medication Sig Start Date End Date Taking? Authorizing Provider  dicyclomine (BENTYL) 20 MG tablet Take 1 tablet (20 mg total) by mouth 2 (two) times daily. 10/12/16   Rolan BuccoMelanie Eleanora Guinyard, MD  ondansetron (ZOFRAN ODT) 4 MG disintegrating tablet 4mg  ODT q4 hours prn nausea/vomit 10/12/16   Rolan BuccoMelanie Jennise Both, MD    Family History No family history on file.  Social History Social History  Substance Use Topics  . Smoking status: Current Every Day Smoker    Packs/day: 0.10    Years: 25.00    Types: Cigarettes  . Smokeless tobacco: Current User  . Alcohol use Yes     Comment: 6 pack/week     Allergies   Codeine   Review of Systems Review of Systems  Constitutional: Positive for fatigue. Negative for chills, diaphoresis and fever.  HENT: Positive for congestion, rhinorrhea and sore throat. Negative for sneezing.     Eyes: Negative.   Respiratory: Positive for cough. Negative for chest tightness and shortness of breath.   Cardiovascular: Negative for chest pain and leg swelling.  Gastrointestinal: Positive for abdominal pain, diarrhea, nausea and vomiting. Negative for blood in stool.  Genitourinary: Negative for difficulty urinating, flank pain, frequency and hematuria.  Musculoskeletal: Positive for myalgias. Negative for arthralgias and back pain.  Skin: Negative for rash.  Neurological: Negative for dizziness, speech difficulty, weakness, numbness and headaches.     Physical Exam Updated Vital Signs BP 128/87 (BP Location: Right Arm)   Pulse 76   Temp 97.9 F (36.6 C) (Oral)   Resp 18   Ht 5\' 10"  (1.778 m)   Wt 205 lb (93 kg)   SpO2 100%   BMI 29.41 kg/m   Physical Exam  Constitutional: He is oriented to person, place, and time. He appears well-developed and well-nourished.  HENT:  Head: Normocephalic and atraumatic.  Right Ear: External ear normal.  Left Ear: External ear normal.  Mouth/Throat: Oropharynx is clear and moist. No oropharyngeal exudate.  Eyes: Pupils are equal, round, and reactive to light.  Neck: Normal range of motion. Neck supple.  Cardiovascular: Normal rate, regular rhythm and normal heart sounds.   Pulmonary/Chest: Effort normal and breath sounds normal. No respiratory distress. He has no wheezes. He has no rales. He exhibits no tenderness.  Abdominal: Soft. Bowel sounds are normal. There is tenderness (Mild diffuse  tenderness). There is no rebound and no guarding.  Musculoskeletal: Normal range of motion. He exhibits no edema.  Lymphadenopathy:    He has no cervical adenopathy.  Neurological: He is alert and oriented to person, place, and time.  Skin: Skin is warm and dry. No rash noted.  Psychiatric: He has a normal mood and affect.     ED Treatments / Results  Labs (all labs ordered are listed, but only abnormal results are displayed) Labs Reviewed   LIPASE, BLOOD  COMPREHENSIVE METABOLIC PANEL  URINALYSIS, ROUTINE W REFLEX MICROSCOPIC (NOT AT [facility])  CBC    EKG  EKG Interpretation None       Radiology Dg Chest 2 View  Result Date: 10/12/2016 CLINICAL DATA:  Cough congestion and abdominal pain. EXAM: CHEST  2 VIEW COMPARISON:  08/17/2016 FINDINGS: The heart size and mediastinal contours are within normal limits. Both lungs are clear. The visualized skeletal structures are unremarkable. IMPRESSION: No active cardiopulmonary disease. Electronically Signed   By: Signa Kellaylor  Stroud M.D.   On: 10/12/2016 08:29    Procedures Procedures (including critical care time)  Medications Ordered in ED Medications  ondansetron (ZOFRAN) injection 4 mg (4 mg Intravenous Given 10/12/16 0809)  sodium chloride 0.9 % bolus 1,000 mL (1,000 mLs Intravenous New Bag/Given 10/12/16 0821)  dicyclomine (BENTYL) capsule 10 mg (10 mg Oral Given 10/12/16 0825)  famotidine (PEPCID) IVPB 20 mg premix (0 mg Intravenous Stopped 10/12/16 0853)     Initial Impression / Assessment and Plan / ED Course  I have reviewed the triage vital signs and the nursing notes.  Pertinent labs & imaging results that were available during my care of the patient were reviewed by me and considered in my medical decision making (see chart for details).  Clinical Course     Patient presents with URI symptoms of nasal congestion and cough. There is no evidence of pneumonia. He's otherwise well-appearing. He's also having crampy abdominal pain which I feel is most likely related to a viral syndrome. He's had associated vomiting and diarrhea. He was treated in the ED with IV fluids and medications. He is feeling better but still having crampy abdominal pain. I discussed the option of doing a CT scan but at this point patient does not want to stay for a CT scan. I advised him to use a clear liquid diet for the next 24 hours. He was given prescriptions for Bentyl and Zofran. He was  advised to return to emergency department if he has any worsening or ongoing abdominal pain.  Final Clinical Impressions(s) / ED Diagnoses   Final diagnoses:  Viral syndrome  Generalized abdominal pain    New Prescriptions New Prescriptions   DICYCLOMINE (BENTYL) 20 MG TABLET    Take 1 tablet (20 mg total) by mouth 2 (two) times daily.   ONDANSETRON (ZOFRAN ODT) 4 MG DISINTEGRATING TABLET    4mg  ODT q4 hours prn nausea/vomit     Rolan BuccoMelanie Carolan Avedisian, MD 10/12/16 1118

## 2016-10-12 NOTE — Discharge Instructions (Signed)
Maintain a clear liquid diet for the next 24 hours. Return to emergency department if your abdominal pain continues or worsens.

## 2016-10-12 NOTE — ED Notes (Signed)
Patient is aware we need a urine specimen. Unable to use restroom at this time. Patient is aware to use the call button to alert us when he is able to go.

## 2016-10-25 IMAGING — CR DG CHEST 2V
2 series · 2 of 2 positions shown · non-contrast
Comparison: Chest radiograph performed 04/28/2015

CLINICAL DATA: Acute onset of generalized chest pain and shortness
of breath. Back pain. Initial encounter.

EXAM:
CHEST  2 VIEW

[w chest pa]
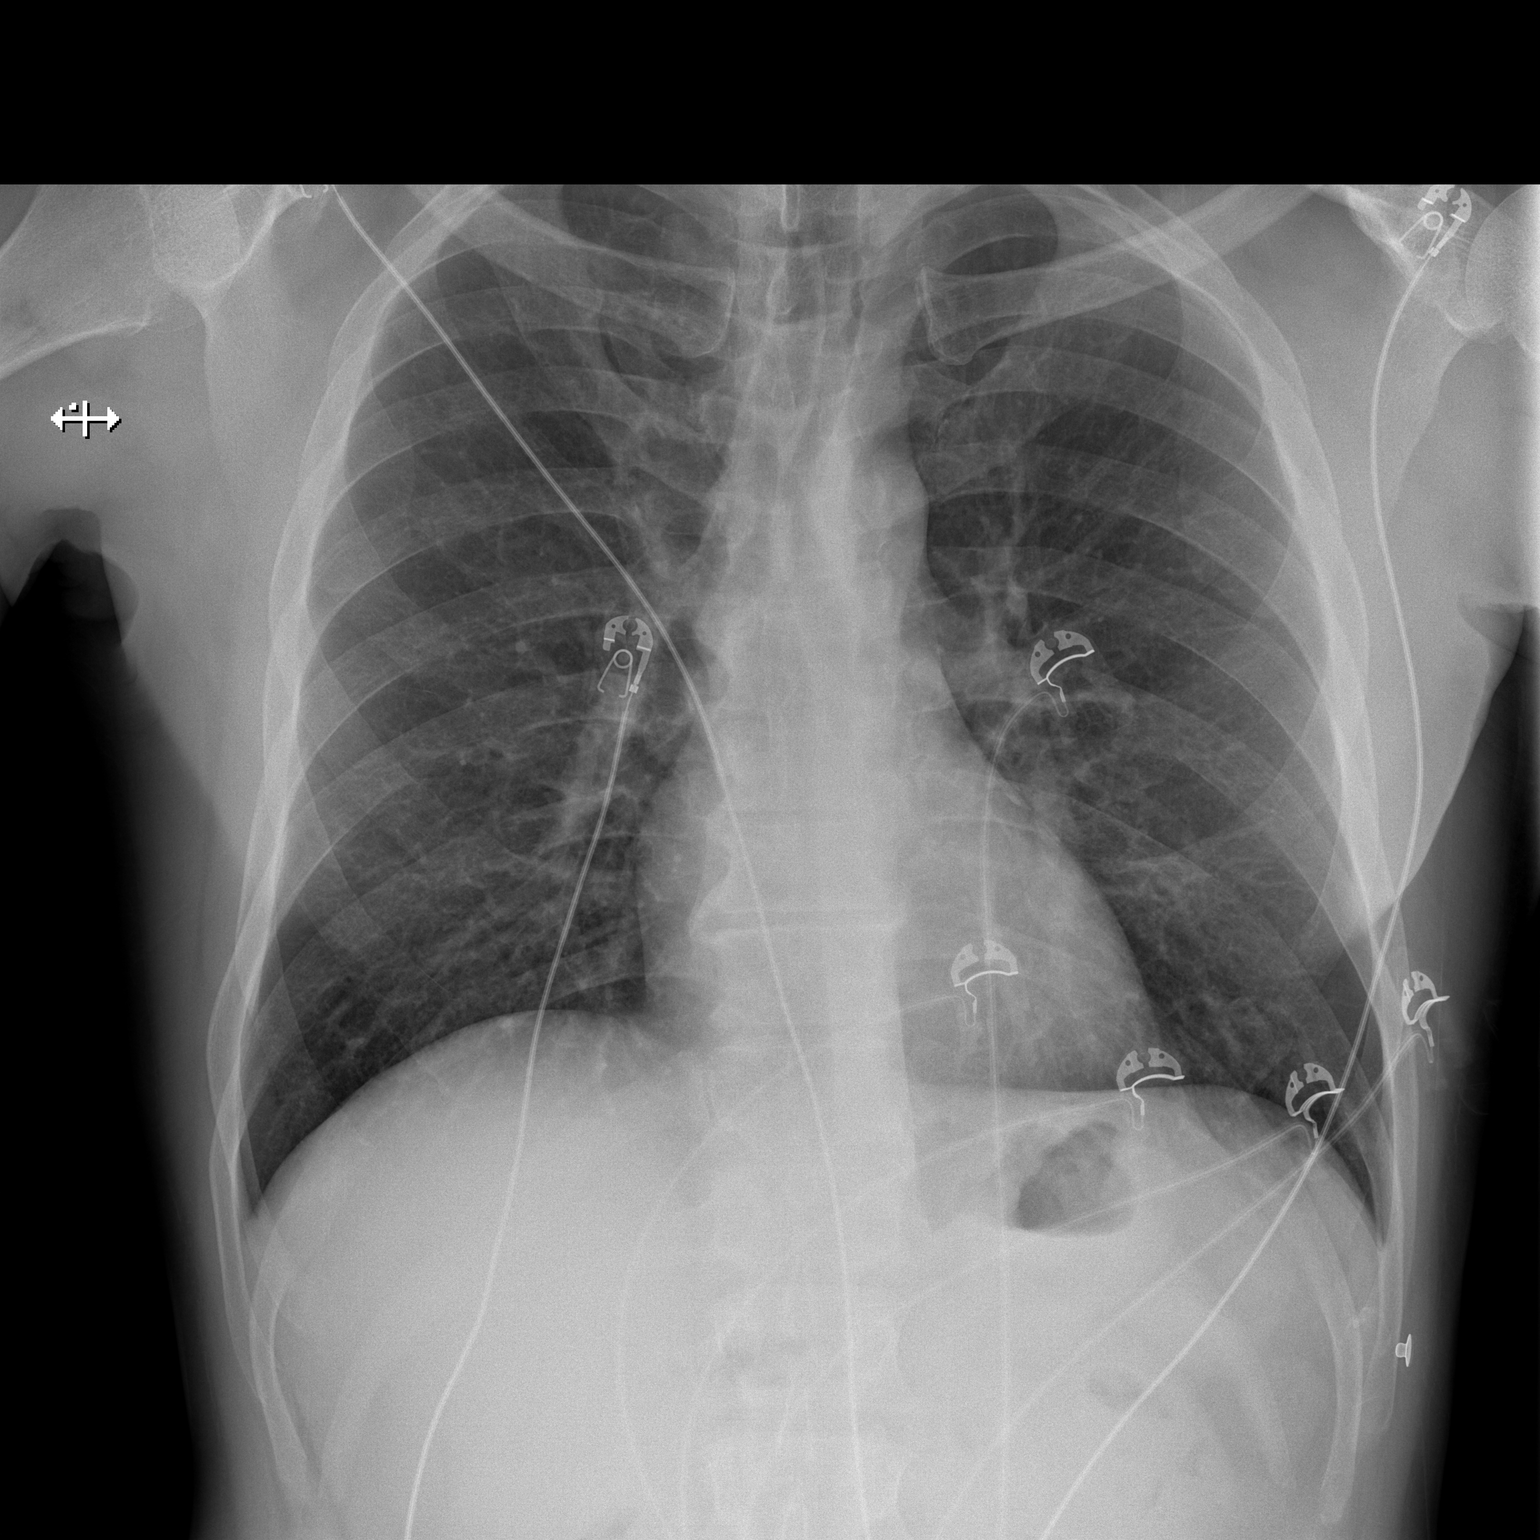

[w chest lat]
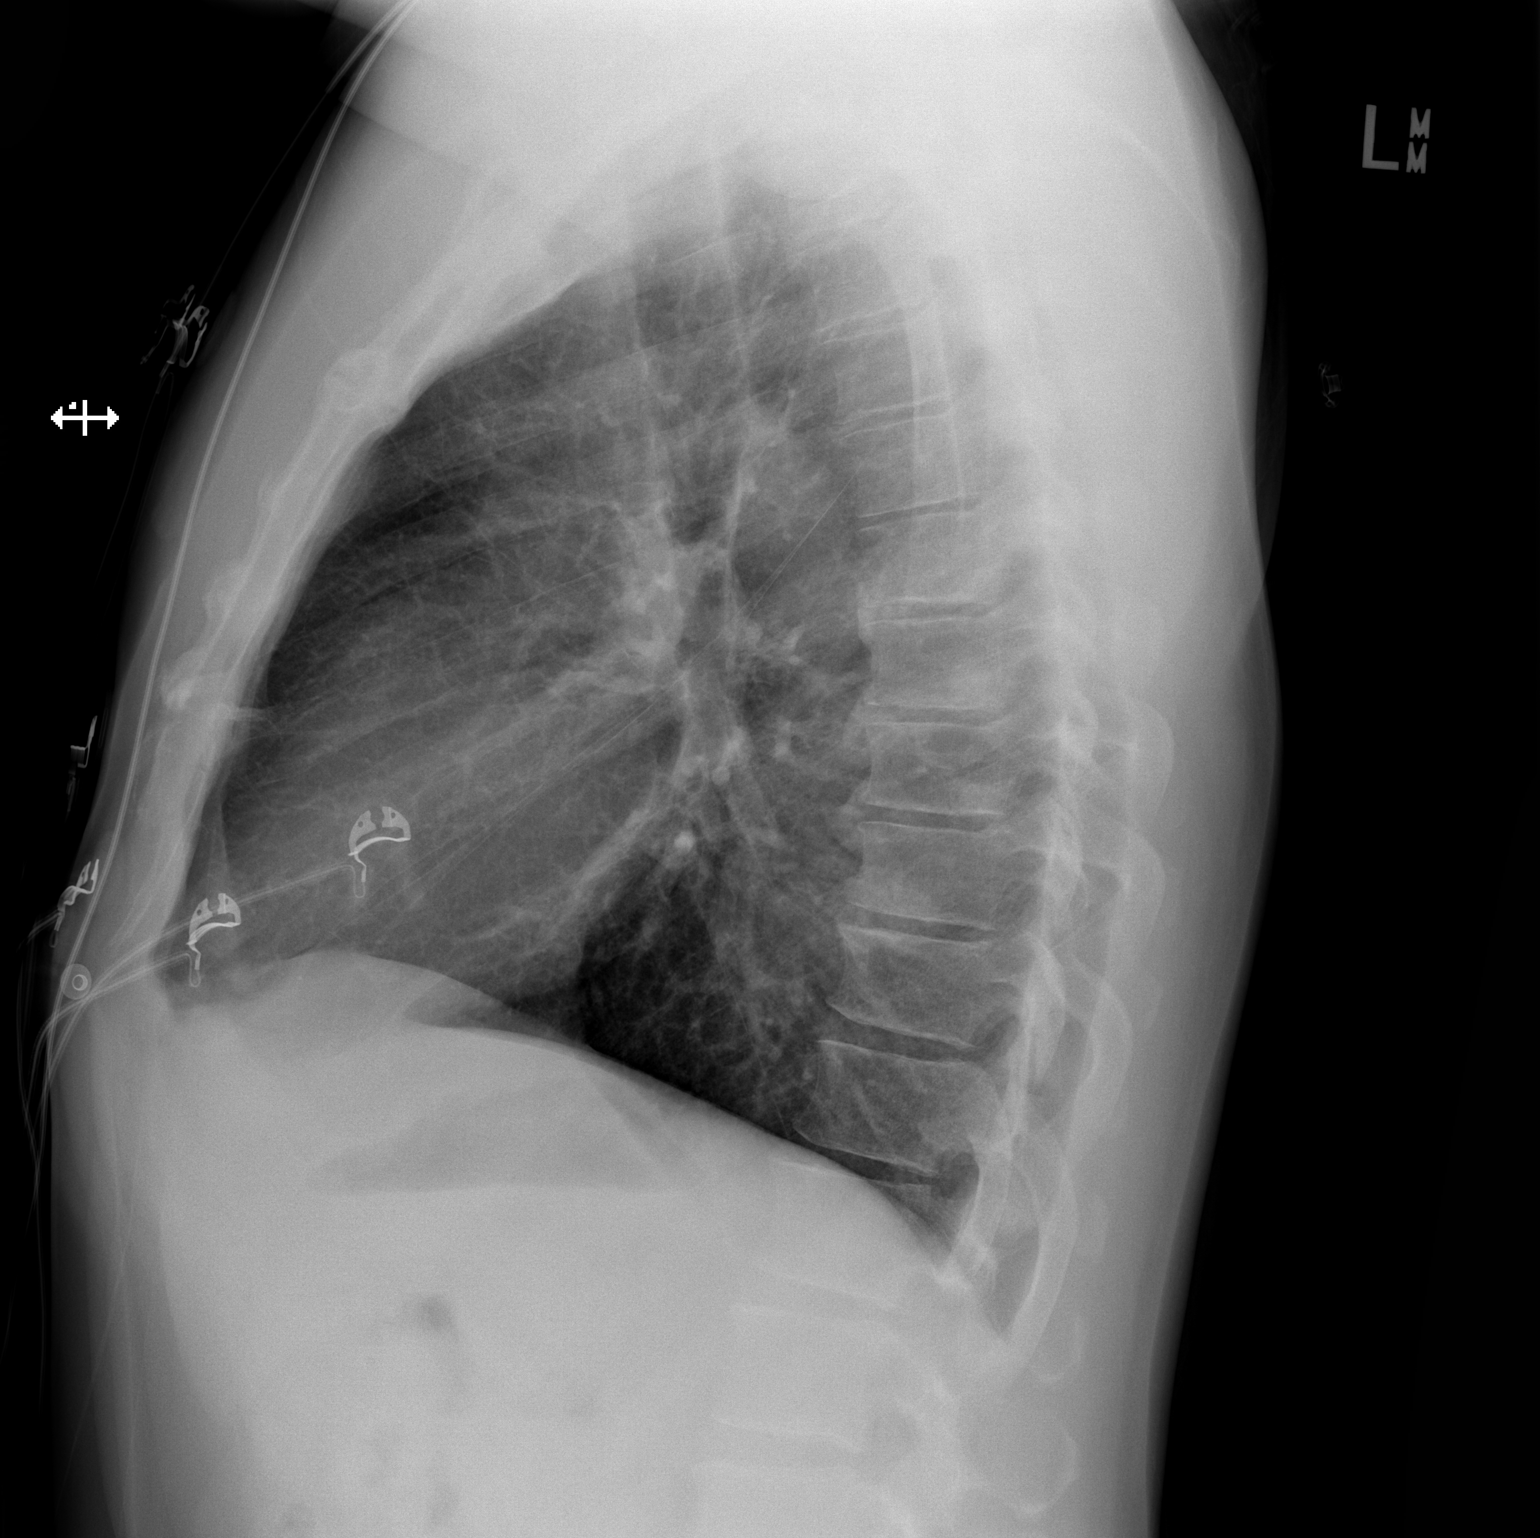

[2 of 2 positions shown; findings below may reference images not displayed]

FINDINGS: The lungs are well-aerated and clear. There is no evidence of focal
opacification, pleural effusion or pneumothorax.

The heart is normal in size; the mediastinal contour is within
normal limits. No acute osseous abnormalities are seen.
IMPRESSION: No acute cardiopulmonary process seen.

## 2017-02-06 ENCOUNTER — Encounter (HOSPITAL_COMMUNITY): Payer: Self-pay | Admitting: Emergency Medicine

## 2017-02-06 ENCOUNTER — Emergency Department (HOSPITAL_COMMUNITY)
Admission: EM | Admit: 2017-02-06 | Discharge: 2017-02-06 | Disposition: A | Discharge status: Home / Self Care | Payer: Self-pay | Attending: Emergency Medicine | Admitting: Emergency Medicine

## 2017-02-06 DIAGNOSIS — Y9289 Other specified places as the place of occurrence of the external cause: Secondary | ICD-10-CM | POA: Insufficient documentation

## 2017-02-06 DIAGNOSIS — F17210 Nicotine dependence, cigarettes, uncomplicated: Secondary | ICD-10-CM | POA: Insufficient documentation

## 2017-02-06 DIAGNOSIS — S39012A Strain of muscle, fascia and tendon of lower back, initial encounter: Secondary | ICD-10-CM | POA: Insufficient documentation

## 2017-02-06 DIAGNOSIS — I10 Essential (primary) hypertension: Secondary | ICD-10-CM | POA: Insufficient documentation

## 2017-02-06 DIAGNOSIS — Y9389 Activity, other specified: Secondary | ICD-10-CM | POA: Insufficient documentation

## 2017-02-06 DIAGNOSIS — X500XXA Overexertion from strenuous movement or load, initial encounter: Secondary | ICD-10-CM | POA: Insufficient documentation

## 2017-02-06 DIAGNOSIS — Z79899 Other long term (current) drug therapy: Secondary | ICD-10-CM | POA: Insufficient documentation

## 2017-02-06 DIAGNOSIS — Y999 Unspecified external cause status: Secondary | ICD-10-CM | POA: Insufficient documentation

## 2017-02-06 MED ORDER — NAPROXEN 375 MG PO TABS
375.0000 mg | ORAL_TABLET | Freq: Once | ORAL | Status: AC
  Administered 2017-02-06: 375 mg via ORAL
  Filled 2017-02-06: qty 1

## 2017-02-06 MED ORDER — NAPROXEN 375 MG PO TABS: 375.0000 mg | ORAL_TABLET | Freq: Two times a day (BID) | ORAL | 0 refills | Status: AC

## 2017-02-06 MED ORDER — CYCLOBENZAPRINE HCL 10 MG PO TABS: 10.0000 mg | ORAL_TABLET | Freq: Every day | ORAL | 0 refills | Status: AC

## 2017-02-06 NOTE — ED Provider Notes (Signed)
[facility]-EMERGENCY DEPT Provider Note   CSN: [id number] Arrival date & time: 02/06/17  1406     History   Chief Complaint Chief Complaint  Patient presents with  . Back Pain    HPI Joseph [name] is a 51 y.o. male.  The history is provided by the patient.  Back Pain   This is a new problem. Episode onset: 1 month. The problem occurs constantly. Progression since onset: fluctuating. The pain is associated with lifting heavy objects and twisting. The pain is present in the sacro-iliac joint. The quality of the pain is described as aching. The pain does not radiate. The pain is moderate. The symptoms are aggravated by bending, twisting and certain positions. Pertinent negatives include no chest pain, no fever, no numbness, no headaches, no abdominal pain, no bowel incontinence, no perianal numbness, no bladder incontinence, no dysuria and no leg pain. He has tried NSAIDs for the symptoms. The treatment provided mild relief.    Past Medical History:  Diagnosis Date  . Hypertension     There are no active problems to display for this patient.   History reviewed. No pertinent surgical history.     Home Medications    Prior to Admission medications   Medication Sig Start Date End Date Taking? Authorizing Provider  cyclobenzaprine (FLEXERIL) 10 MG tablet Take 1 tablet (10 mg total) by mouth at bedtime. 02/06/17 02/16/17  Nira Conn, MD  dicyclomine (BENTYL) 20 MG tablet Take 1 tablet (20 mg total) by mouth 2 (two) times daily. 10/12/16   Rolan Bucco, MD  naproxen (NAPROSYN) 375 MG tablet Take 1 tablet (375 mg total) by mouth 2 (two) times daily with a meal. 02/06/17 02/16/17  Nira Conn, MD  ondansetron (ZOFRAN ODT) 4 MG disintegrating tablet 4mg  ODT q4 hours prn nausea/vomit 10/12/16   Rolan Bucco, MD    Family History History reviewed. No pertinent family history.  Social History Social History  Substance Use Topics  . Smoking status: Current Every  Day Smoker    Packs/day: 0.10    Years: 25.00    Types: Cigarettes  . Smokeless tobacco: Current User  . Alcohol use Yes     Comment: 6 pack/week     Allergies   Codeine   Review of Systems Review of Systems  Constitutional: Negative for fever.  Cardiovascular: Negative for chest pain.  Gastrointestinal: Negative for abdominal pain and bowel incontinence.  Genitourinary: Negative for bladder incontinence and dysuria.  Musculoskeletal: Positive for back pain.  Neurological: Negative for numbness and headaches.  Ten systems are reviewed and are negative for acute change except as noted in the HPI   Physical Exam Updated Vital Signs BP 120/75 (BP Location: Left Arm)   Pulse 97   Temp 97.7 F (36.5 C) (Oral)   Resp 17   SpO2 95%   Physical Exam  Constitutional: He is oriented to person, place, and time. He appears well-developed and well-nourished. No distress.  HENT:  Head: Normocephalic and atraumatic.  Right Ear: External ear normal.  Left Ear: External ear normal.  Nose: Nose normal.  Mouth/Throat: Mucous membranes are normal. No trismus in the jaw.  Eyes: Conjunctivae and EOM are normal. No scleral icterus.  Neck: Normal range of motion and phonation normal.  Cardiovascular: Normal rate and regular rhythm.   Pulmonary/Chest: Effort normal. No stridor. No respiratory distress.  Abdominal: He exhibits no distension.  Musculoskeletal: Normal range of motion. He exhibits no edema.       Lumbar  back: He exhibits tenderness. He exhibits no bony tenderness.       Back:  Neurological: He is alert and oriented to person, place, and time.  Spine Exam:  Strength: 5/5 throughout LE bilaterally (hip flexion/extension, adduction/abduction; knee flexion/extension; foot dorsiflexion/plantarflexion, inversion/eversion; great toe inversion) Sensation: Intact to light touch in proximal and distal LE bilaterally Reflexes: 2+ quadriceps and achilles reflexes    Skin: He is not  diaphoretic.  Psychiatric: He has a normal mood and affect. His behavior is normal.  Vitals reviewed.    ED Treatments / Results  Labs (all labs ordered are listed, but only abnormal results are displayed) Labs Reviewed - No data to display  EKG  EKG Interpretation None       Radiology No results found.  Procedures Procedures (including critical care time)  Medications Ordered in ED Medications  naproxen (NAPROSYN) tablet 375 mg (not administered)     Initial Impression / Assessment and Plan / ED Course  I have reviewed the triage vital signs and the nursing notes.  Pertinent labs & imaging results that were available during my care of the patient were reviewed by me and considered in my medical decision making (see chart for details).     51 y.o. male presents with back pain in lumbar area for 1 month without signs of radicular pain. No acute traumatic onset. No red flag symptoms of fever, weight loss, saddle anesthesia, weakness, fecal/urinary incontinence or urinary retention.   Suspect MSK etiology. No indication for imaging emergently. Patient was recommended to take short course of scheduled NSAIDs and engage in early mobility as definitive treatment. Return precautions discussed for worsening or new concerning symptoms.    The patient is safe for discharge with strict return precautions.  Final Clinical Impressions(s) / ED Diagnoses   Final diagnoses:  Strain of lumbar region, initial encounter   Disposition: Discharge  Condition: Good  I have discussed the results, Dx and Tx plan with the patient who expressed understanding and agree(s) with the plan. Discharge instructions discussed at great length. The patient was given strict return precautions who verbalized understanding of the instructions. No further questions at time of discharge.    New Prescriptions   CYCLOBENZAPRINE (FLEXERIL) 10 MG TABLET    Take 1 tablet (10 mg total) by mouth at bedtime.     NAPROXEN (NAPROSYN) 375 MG TABLET    Take 1 tablet (375 mg total) by mouth 2 (two) times daily with a meal.    Follow Up: primary care provider         Nira ConnPedro Eduardo Zakery Normington, MD 02/06/17 1452

## 2017-02-06 NOTE — ED Triage Notes (Signed)
Pt reports ongoing midline lower back pain with radiation to R hip. No known injuries

## 2017-02-07 ENCOUNTER — Encounter (HOSPITAL_COMMUNITY): Payer: Self-pay | Admitting: Oncology

## 2017-02-07 ENCOUNTER — Emergency Department (HOSPITAL_COMMUNITY)
Admission: EM | Admit: 2017-02-07 | Discharge: 2017-02-07 | Disposition: A | Discharge status: Home / Self Care | Payer: Self-pay | Attending: Emergency Medicine | Admitting: Emergency Medicine

## 2017-02-07 DIAGNOSIS — Y929 Unspecified place or not applicable: Secondary | ICD-10-CM | POA: Insufficient documentation

## 2017-02-07 DIAGNOSIS — Y990 Civilian activity done for income or pay: Secondary | ICD-10-CM | POA: Insufficient documentation

## 2017-02-07 DIAGNOSIS — Z79899 Other long term (current) drug therapy: Secondary | ICD-10-CM | POA: Insufficient documentation

## 2017-02-07 DIAGNOSIS — Y939 Activity, unspecified: Secondary | ICD-10-CM | POA: Insufficient documentation

## 2017-02-07 DIAGNOSIS — W269XXA Contact with unspecified sharp object(s), initial encounter: Secondary | ICD-10-CM | POA: Insufficient documentation

## 2017-02-07 DIAGNOSIS — S61215A Laceration without foreign body of left ring finger without damage to nail, initial encounter: Secondary | ICD-10-CM | POA: Insufficient documentation

## 2017-02-07 DIAGNOSIS — Z23 Encounter for immunization: Secondary | ICD-10-CM | POA: Insufficient documentation

## 2017-02-07 DIAGNOSIS — I10 Essential (primary) hypertension: Secondary | ICD-10-CM | POA: Insufficient documentation

## 2017-02-07 DIAGNOSIS — F17210 Nicotine dependence, cigarettes, uncomplicated: Secondary | ICD-10-CM | POA: Insufficient documentation

## 2017-02-07 MED ORDER — IBUPROFEN 800 MG PO TABS
800.0000 mg | ORAL_TABLET | Freq: Once | ORAL | Status: AC
  Administered 2017-02-07: 800 mg via ORAL
  Filled 2017-02-07: qty 1

## 2017-02-07 MED ORDER — CEPHALEXIN 500 MG PO CAPS: 500.0000 mg | ORAL_CAPSULE | Freq: Four times a day (QID) | ORAL | 0 refills | Status: DC

## 2017-02-07 MED ORDER — TETANUS-DIPHTH-ACELL PERTUSSIS 5-2.5-18.5 LF-MCG/0.5 IM SUSP
0.5000 mL | Freq: Once | INTRAMUSCULAR | Status: AC
  Administered 2017-02-07: 0.5 mL via INTRAMUSCULAR
  Filled 2017-02-07: qty 0.5

## 2017-02-07 MED ORDER — BACITRACIN ZINC 500 UNIT/GM EX OINT
TOPICAL_OINTMENT | CUTANEOUS | Status: AC
  Administered 2017-02-07: 1
  Filled 2017-02-07: qty 0.9

## 2017-02-07 MED ORDER — CEPHALEXIN 500 MG PO CAPS
500.0000 mg | ORAL_CAPSULE | Freq: Once | ORAL | Status: AC
  Administered 2017-02-07: 500 mg via ORAL
  Filled 2017-02-07: qty 1

## 2017-02-07 NOTE — ED Triage Notes (Signed)
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  On Friday pt cut the tip of his left ring finger off. No nail involvement.  Since that time pt has had finger wrapped extremely tightly w/ tape.  Finger is cool to the touch.  Site cleansed.  Cap refill less than 3 seconds.  Pt rates pain 10/10, throbbing and burning in nature.

## 2017-02-07 NOTE — ED Notes (Signed)
Bed: [facility] Expected date:  Expected time:  Means of arrival:  Comments: 

## 2017-02-07 NOTE — Discharge Instructions (Signed)
1. Medications: Tylenol or ibuprofen for pain, Keflex, usual home medications 2. Treatment: ice for swelling, keep wound clean with warm soap and water and keep bandage dry, do not submerge in water for 24 hours 3. Follow Up: Return to the emergency department for increased redness, drainage of pus from the wound   WOUND CARE  Keep area clean and dry for 24 hours. Do not remove bandage, if applied.  After 24 hours, remove bandage and wash wound gently with mild soap and warm water. Reapply a new bandage after cleaning wound, if directed.   Continue daily cleansing with soap and water. Return if you experience any of the following signs of infection: Swelling, redness, pus drainage, streaking, fever >101.0 F  Return if you experience excessive bleeding that does not stop after 15-20 minutes of constant, firm pressure.

## 2017-02-07 NOTE — ED Notes (Signed)
Lac cleaned, placed bacitracin on it and bandaged.  Follow up instructions given.

## 2017-02-07 NOTE — ED Provider Notes (Signed)
[facility] Provider Note   CSN: [id number] Arrival date & time: 02/07/17  0411     History   Chief Complaint Chief Complaint  Patient presents with  . Extremity Laceration    HPI Joseph [name] is a 51 y.o. male with a hx of HTN presents to the Emergency Department complaining of Acute, persistent, laceration to the left ring finger onset approximately 24 hours ago.  She reports that he rinsed the wound and wrapped his finger with tape. He reports that he has significant pain in the finger described as throbbing and burning and rated at a 10/10. He has not attempted any medications for his pain. Nothing makes it better or worse. He denies fevers or chills, numbness, tingling, weakness.   The history is provided by the patient and medical records. No language interpreter was used.    Past Medical History:  Diagnosis Date  . Hypertension     There are no active problems to display for this patient.   History reviewed. No pertinent surgical history.     Home Medications    Prior to Admission medications   Medication Sig Start Date End Date Taking? Authorizing Provider  cephALEXin (KEFLEX) 500 MG capsule Take 1 capsule (500 mg total) by mouth 4 (four) times daily. 02/07/17   Braddock Servellon, PA-C  cyclobenzaprine (FLEXERIL) 10 MG tablet Take 1 tablet (10 mg total) by mouth at bedtime. 02/06/17 02/16/17  Nira ConnPedro Eduardo Cardama, MD  dicyclomine (BENTYL) 20 MG tablet Take 1 tablet (20 mg total) by mouth 2 (two) times daily. 10/12/16   Rolan BuccoMelanie Belfi, MD  naproxen (NAPROSYN) 375 MG tablet Take 1 tablet (375 mg total) by mouth 2 (two) times daily with a meal. 02/06/17 02/16/17  Nira ConnPedro Eduardo Cardama, MD  ondansetron (ZOFRAN ODT) 4 MG disintegrating tablet 4mg  ODT q4 hours prn nausea/vomit 10/12/16   Rolan BuccoMelanie Belfi, MD    Family History No family history on file.  Social History Social History  Substance Use Topics  . Smoking status: Current Every Day Smoker   Packs/day: 0.10    Years: 25.00    Types: Cigarettes  . Smokeless tobacco: Current User  . Alcohol use Yes     Comment: 6 pack/week     Allergies   Codeine   Review of Systems Review of Systems  Skin: Positive for wound.  All other systems reviewed and are negative.    Physical Exam Updated Vital Signs BP (!) 149/95 (BP Location: Right Arm)   Pulse 84   Temp 97.2 F (36.2 C) (Oral)   Resp 20   SpO2 99%   Physical Exam  Constitutional: He is oriented to person, place, and time. He appears well-developed and well-nourished. No distress.  HENT:  Head: Normocephalic and atraumatic.  Eyes: Conjunctivae are normal. No scleral icterus.  Neck: Normal range of motion.  Cardiovascular: Normal rate, regular rhythm, normal heart sounds and intact distal pulses.   No murmur heard. Capillary refill < 3 sec  Pulmonary/Chest: Effort normal and breath sounds normal. No respiratory distress.  Musculoskeletal: Normal range of motion. He exhibits no edema.  Full range of motion of the left ring finger amputation of the distal tip of the pad of left ring finger without nailbed involvement Sensation intact the entire left ring finger No bone involvement.  Neurological: He is alert and oriented to person, place, and time.  Skin: Skin is warm and dry. He is not diaphoretic.  Psychiatric: He has a normal mood and affect.  Nursing  note and vitals reviewed.    ED Treatments / Results    Procedures Procedures (including critical care time)  Medications Ordered in ED Medications  ibuprofen (ADVIL,MOTRIN) tablet 800 mg (not administered)  Tdap (BOOSTRIX) injection 0.5 mL (not administered)  cephALEXin (KEFLEX) capsule 500 mg (not administered)     Initial Impression / Assessment and Plan / ED Course  I have reviewed the triage vital signs and the nursing notes.  Pertinent labs & imaging results that were available during my care of the patient were reviewed by me and considered  in my medical decision making (see chart for details).      Patient with indentation of the distal tip of the pad of the left ring finger.  Treatments prior to arrival. Wound cleaned in the emergency department and bandage. Nonsuturable. No nailbed involvement. 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  Recommend NSAIDs for this. Discussed wound care. Tetanus updated. Patient states understanding and is in agreement with the plan.  Patient noted to be hypertensive in the emergency department.  No signs of hypertensive urgency.  Discussed with patient the need for close follow-up and management by their primary care physician.    Final Clinical Impressions(s) / ED Diagnoses   Final diagnoses:  Laceration of left ring finger without foreign body without damage to nail, initial encounter    New Prescriptions New Prescriptions   CEPHALEXIN (KEFLEX) 500 MG CAPSULE    Take 1 capsule (500 mg total) by mouth 4 (four) times daily.     Joseph Client  Carmack, PA-C 02/07/17 1610    Azalia Bilis, MD 02/08/17 [phone]

## 2017-02-18 ENCOUNTER — Emergency Department (HOSPITAL_COMMUNITY)
Admission: EM | Admit: 2017-02-18 | Discharge: 2017-02-18 | Disposition: A | Discharge status: Home / Self Care | Payer: Self-pay | Attending: Emergency Medicine | Admitting: Emergency Medicine

## 2017-02-18 ENCOUNTER — Encounter (HOSPITAL_COMMUNITY): Payer: Self-pay | Admitting: Emergency Medicine

## 2017-02-18 DIAGNOSIS — I10 Essential (primary) hypertension: Secondary | ICD-10-CM | POA: Insufficient documentation

## 2017-02-18 DIAGNOSIS — M545 Low back pain, unspecified: Secondary | ICD-10-CM

## 2017-02-18 DIAGNOSIS — M62830 Muscle spasm of back: Secondary | ICD-10-CM | POA: Insufficient documentation

## 2017-02-18 DIAGNOSIS — Z79899 Other long term (current) drug therapy: Secondary | ICD-10-CM | POA: Insufficient documentation

## 2017-02-18 DIAGNOSIS — F17210 Nicotine dependence, cigarettes, uncomplicated: Secondary | ICD-10-CM | POA: Insufficient documentation

## 2017-02-18 MED ORDER — CYCLOBENZAPRINE HCL 10 MG PO TABS: 10.0000 mg | ORAL_TABLET | Freq: Three times a day (TID) | ORAL | 0 refills | Status: DC | PRN

## 2017-02-18 MED ORDER — MELOXICAM 15 MG PO TABS: 15.0000 mg | ORAL_TABLET | Freq: Every day | ORAL | 0 refills | Status: DC

## 2017-02-18 NOTE — ED Provider Notes (Signed)
[facility] Provider Note   CSN: [id number] Arrival date & time: 02/18/17  1143   By signing my name below, I, Joseph [name], attest that this documentation has been prepared under the direction and in the presence of [id number] [employer]. Electronically signed, Joseph [name], ED Scribe. 02/18/17. 12:41 PM.   History   Chief Complaint Chief Complaint  Patient presents with  . Back Pain   The history is provided by the patient and medical records. No language interpreter was used.  Back Pain   This is a recurrent problem. The current episode started more than 1 week ago. The problem occurs daily. The problem has not changed since onset.The pain is associated with no known injury. The pain is present in the lumbar spine. Quality: sharp. The pain does not radiate. The pain is at a severity of 8/10. The pain is moderate. The symptoms are aggravated by certain positions, bending and twisting. The pain is the same all the time. Pertinent negatives include no chest pain, no fever, no numbness, no abdominal pain, no bowel incontinence, no perianal numbness, no bladder incontinence, no dysuria, no leg pain, no paresthesias, no paresis, no tingling and no weakness. He has tried NSAIDs and muscle relaxants for the symptoms. The treatment provided mild relief.    HPI Comments: Joseph [name] is a 51 y.o. male with a PMHx of HTN, who presents to the Emergency Department complaining of ongoing mid-low back pain x  ~1 month. He notes 8/10, sharp, constant, non-radiating lower back pain that is worse with "standing at work"; mildly improved with naproxen, aleve, and flexeril. He has not used tylenol, heat or ice, or any other treatments. Pt notes no associated symptoms. He states he was seen in [facility] ED for this pain on 02/06/2017 when he was given prescriptions for naproxen and  Flexeril with only mild relief. No X-Rays or work up performed at this time. Pt reportedly works at [employer],  uses crocs to work in; tried getting insoles for them but that hasn't helped. Denies Hx of cancer or IV drug use. Denies any known injury or trauma. Denies fevers, chills, CP, SOB, abd pain, N/V/D/C, incontinence of urine/stool, hematuria, dysuria, saddle anesthesia/cauda equina symptoms, numbness, tingling, focal weakness, or any other complaints at this time.    Past Medical History:  Diagnosis Date  . Hypertension     There are no active problems to display for this patient.   History reviewed. No pertinent surgical history.     Home Medications    Prior to Admission medications   Medication Sig Start Date End Date Taking? Authorizing Provider  cephALEXin (KEFLEX) 500 MG capsule Take 1 capsule (500 mg total) by mouth 4 (four) times daily. 02/07/17   Hannah Muthersbaugh, PA-C  cyclobenzaprine (FLEXERIL) 10 MG tablet Take 1 tablet (10 mg total) by mouth 3 (three) times daily as needed for muscle spasms. 02/18/17   Diannia Hogenson, PA-C  dicyclomine (BENTYL) 20 MG tablet Take 1 tablet (20 mg total) by mouth 2 (two) times daily. 10/12/16   Rolan Bucco, MD  meloxicam (MOBIC) 15 MG tablet Take 1 tablet (15 mg total) by mouth daily. TAKE WITH MEALS 02/18/17   Burlon Centrella, PA-C  ondansetron (ZOFRAN ODT) 4 MG disintegrating tablet  ODT q4 hours prn nausea/vomit 10/12/16   Rolan Bucco, MD    Family History No family history on file.  Social History Social History  Substance Use Topics  . Smoking status: Current Every Day Smoker  Packs/day: 0.10    Years: 25.00    Types: Cigarettes  . Smokeless tobacco: Current User  . Alcohol use Yes     Comment: 6 pack/week     Allergies   Codeine   Review of Systems Review of Systems  Constitutional: Negative for chills and fever.  Respiratory: Negative for shortness of breath.   Cardiovascular: Negative for chest pain.  Gastrointestinal: Negative for abdominal pain, bowel incontinence, constipation, diarrhea, nausea and  vomiting.  Genitourinary: Negative for bladder incontinence, difficulty urinating (no incontinence), dysuria and hematuria.  Musculoskeletal: Positive for back pain. Negative for arthralgias and myalgias.  Skin: Negative for color change.  Allergic/Immunologic: Negative for immunocompromised state.  Neurological: Negative for tingling, weakness, numbness and paresthesias.  Psychiatric/Behavioral: Negative for confusion.   10 Systems reviewed and all are negative for acute change except as noted in the HPI.    Physical Exam Updated Vital Signs BP (!) 149/103 (BP Location: Left Arm)   Pulse 91   Temp 98.7 F (37.1 C) (Oral)   Resp 17   SpO2 98%   Physical Exam  Constitutional: He is oriented to person, place, and time. Vital signs are normal. He appears well-developed and well-nourished.  Non-toxic appearance. No distress.  Afebrile, nontoxic, NAD  HENT:  Head: Normocephalic and atraumatic.  Mouth/Throat: Mucous membranes are normal.  Eyes: Conjunctivae and EOM are normal. Right eye exhibits no discharge. Left eye exhibits no discharge.  Neck: Normal range of motion. Neck supple.  Cardiovascular: Normal rate and intact distal pulses.   Pulmonary/Chest: Effort normal. No respiratory distress.  Abdominal: Normal appearance. He exhibits no distension.  Musculoskeletal: Normal range of motion.       Lumbar back: He exhibits tenderness and spasm. He exhibits normal range of motion, no bony tenderness, no swelling and no deformity.  Lumbar spine with FROM intact without spinous process TTP, no bony stepoffs or deformities, with mild bilateral paraspinous muscle TTP and muscle spasms. Strength and sensation grossly intact in all extremities, negative SLR bilaterally, gait steady and nonantalgic. No overlying skin changes. Distal pulses intact.   Neurological: He is alert and oriented to person, place, and time. He has normal strength. No sensory deficit.  Skin: Skin is warm, dry and intact.  No rash noted.  Psychiatric: He has a normal mood and affect.  Nursing note and vitals reviewed.    ED Treatments / Results  DIAGNOSTIC STUDIES: Oxygen Saturation is 98% on RA, normal by my interpretation.    COORDINATION OF CARE: 12:26 PM Discussed treatment plan with pt at bedside and pt agreed to plan. Pt advised of symptomatic care at home and instructed to F/U with PCP. Will Rx medications.  Labs (all labs ordered are listed, but only abnormal results are displayed) Labs Reviewed - No data to display  EKG  EKG Interpretation None       Radiology No results found.  Procedures Procedures (including critical care time)  Medications Ordered in ED Medications - No data to display   Initial Impression / Assessment and Plan / ED Course  I have reviewed the triage vital signs and the nursing notes.  Pertinent labs & imaging results that were available during my care of the patient were reviewed by me and considered in my medical decision making (see chart for details).     51 y.o. male here with with c/o low back pain. On exam, mild b/l paraspinous muscle TTP and spasms; no midline spinal tenderness. No red flag s/s of low  back pain. No s/s of central cord compression or cauda equina. Lower extremities are neurovascularly intact and patient is ambulating without difficulty. No urinary complaints. Doubt need for imaging/labs, likely muscular strain.  Patient was counseled on back pain precautions and told to do activity as tolerated but do not lift, push, or pull heavy objects more than 10 pounds for the next week. Patient counseled to use ice or heat on back for no longer than 15 minutes every hour.   Rx given for muscle relaxer and counseled on proper use of muscle relaxant medication. Urged patient not to drink alcohol, drive, or perform any other activities that requires focus while taking these medications. Rx for mobic given, advised no ongoing need for other NSAIDs.  Advised tylenol use as well. Discussed proper foot wear to help posture and improve symptoms.   Patient urged to follow-up with PCP if pain does not improve with treatment and rest or if pain becomes recurrent. Urged to return with worsening severe pain, loss of bowel or bladder control, trouble walking. The patient verbalizes understanding and agrees with the plan.   I personally performed the services described in this documentation, which was scribed in my presence. The recorded information has been reviewed and is accurate.   Final Clinical Impressions(s) / ED Diagnoses   Final diagnoses:  Acute bilateral low back pain without sciatica  Muscle spasm of back    New Prescriptions New Prescriptions   CYCLOBENZAPRINE (FLEXERIL) 10 MG TABLET    Take 1 tablet (10 mg total) by mouth 3 (three) times daily as needed for muscle spasms.   MELOXICAM (MOBIC) 15 MG TABLET    Take 1 tablet (15 mg total) by mouth daily. TAKE WITH [address] PA-C 02/18/17 1249    Pricilla Loveless, MD 02/22/17 [phone]

## 2017-02-18 NOTE — Discharge Instructions (Signed)
Back Pain: Your back pain should be treated with medicines such as ibuprofen or aleve and this back pain should get better over the next 2 weeks.  However if you develop severe or worsening pain, low back pain with fever, numbness, weakness or inability to walk or urinate, you should return to the ER immediately.  Please follow up with your doctor this week for a recheck if still having symptoms.  Avoid heavy lifting over 10 pounds over the next two weeks.  Low back pain is discomfort in the lower back that may be due to injuries to muscles and ligaments around the spine.  Occasionally, it may be caused by a a problem to a part of the spine called a disc.  The pain may last several days or a week;  However, most patients get completely well in 4 weeks.  Self - care:  The application of heat can help soothe the pain.  Maintaining your daily activities, including walking, is encourged, as it will help you get better faster than just staying in bed. Perform gentle stretching as discussed. Drink plenty of fluids.  Medications are also useful to help with pain control.  A commonly prescribed medication includes tylenol. Take as directed on the over the counter bottle.  Non steroidal anti inflammatory medications including mobic;  These medications help both pain and swelling and are very useful in treating back pain.  They should be taken with food, as they can cause stomach upset, and more seriously, stomach bleeding.  YOU DO NOT NEED TO TAKE ANY ADDITIONAL NSAIDS LIKE IBUPROFEN, ALEVE, NAPROSYN, ETC  Muscle relaxants:  These medications can help with muscle tightness that is a cause of lower back pain.  Most of these medications can cause drowsiness, and it is not safe to drive or use dangerous machinery while taking them.  SEEK IMMEDIATE MEDICAL ATTENTION IF: New numbness, tingling, weakness, or problem with the use of your arms or legs.  Severe back pain not relieved with medications.  Difficulty with  or loss of control of your bowel or bladder control.  Increasing pain in any areas of the body (such as chest or abdominal pain).  Shortness of breath, dizziness or fainting.  Nausea (feeling sick to your stomach), vomiting, fever, or sweats.  You will need to follow up with  Your primary healthcare provider in 1-2 weeks for reassessment.

## 2017-02-18 NOTE — ED Notes (Signed)
See PAs notes for secondary assessment.  

## 2017-02-18 NOTE — ED Triage Notes (Signed)
Patient reports mid lower back pain that has been on going over a month. Patient reports its worse while he is standing at work. Patient was seen here about week ago and was given naproxen and flexeril and states not helping pain. Patient tried getting new shoes but still having pain. Patient reports that when he was here last time they didn't do any xrays.

## 2017-04-29 ENCOUNTER — Encounter (HOSPITAL_COMMUNITY): Payer: Self-pay

## 2017-04-29 ENCOUNTER — Emergency Department (HOSPITAL_COMMUNITY)
Admission: EM | Admit: 2017-04-29 | Discharge: 2017-04-29 | Disposition: A | Discharge status: Home / Self Care | Payer: Self-pay | Attending: Emergency Medicine | Admitting: Emergency Medicine

## 2017-04-29 DIAGNOSIS — M545 Low back pain, unspecified: Secondary | ICD-10-CM

## 2017-04-29 DIAGNOSIS — I10 Essential (primary) hypertension: Secondary | ICD-10-CM | POA: Insufficient documentation

## 2017-04-29 DIAGNOSIS — Z79899 Other long term (current) drug therapy: Secondary | ICD-10-CM | POA: Insufficient documentation

## 2017-04-29 DIAGNOSIS — F17210 Nicotine dependence, cigarettes, uncomplicated: Secondary | ICD-10-CM | POA: Insufficient documentation

## 2017-04-29 MED ORDER — NAPROXEN 500 MG PO TABS: 500.0000 mg | ORAL_TABLET | Freq: Two times a day (BID) | ORAL | 0 refills | Status: DC

## 2017-04-29 MED ORDER — METHOCARBAMOL 500 MG PO TABS: 500.0000 mg | ORAL_TABLET | Freq: Two times a day (BID) | ORAL | 0 refills | Status: DC | PRN

## 2017-04-29 NOTE — ED Notes (Signed)
Hx of chronic LBP.

## 2017-04-29 NOTE — ED Triage Notes (Signed)
Patient complains of ongoing lower back pain for months. Pain started after work, no distress

## 2017-04-29 NOTE — ED Provider Notes (Signed)
[facility]-EMERGENCY DEPT Provider Note   CSN: [id number] Arrival date & time: 04/29/17  1057  By signing my name below, I, Deland Pretty, attest that this documentation has been prepared under the direction and in the presence of Everlene Farrier, PA-C. Electronically Signed: Deland Pretty, ED Scribe. 04/29/17. 11:28 AM.  History   Chief Complaint No chief complaint on file.  The history is provided by the patient. No language interpreter was used.   HPI Comments: Joseph [name] is a 51 y.o. male who presents to the Emergency Department complaining of chronic bilateral lower back pain that began months ago but worsened a few days ago. Pt denies injury.  He states that standing up and movement exacerbates pain. He reports lots of bending at his job. The pt denies bowel incontinence. He has no SHx of recreational drug use. The pt denies a hx of cancer. The pt denies difficulty urinating, hematuria, vomiting, abdominal pain, weight changes, numbness, tingling, and nausea. He reports that he took aleve efore today with inadequate relief nothing today.  Past Medical History:  Diagnosis Date  . Hypertension     There are no active problems to display for this patient.   History reviewed. No pertinent surgical history.     Home Medications    Prior to Admission medications   Medication Sig Start Date End Date Taking? Authorizing Provider  dicyclomine (BENTYL) 20 MG tablet Take 1 tablet (20 mg total) by mouth 2 (two) times daily. 10/12/16   Rolan Bucco, MD  methocarbamol (ROBAXIN) 500 MG tablet Take 1 tablet (500 mg total) by mouth 2 (two) times daily as needed for muscle spasms. 04/29/17   Everlene Farrier, PA-C  naproxen (NAPROSYN) 500 MG tablet Take 1 tablet (500 mg total) by mouth 2 (two) times daily with a meal. 04/29/17   Everlene Farrier, PA-C  ondansetron (ZOFRAN ODT) 4 MG disintegrating tablet 4mg  ODT q4 hours prn nausea/vomit 10/12/16   Rolan Bucco, MD    Family  History No family history on file.  Social History Social History  Substance Use Topics  . Smoking status: Current Every Day Smoker    Packs/day: 0.10    Years: 25.00    Types: Cigarettes  . Smokeless tobacco: Current User  . Alcohol use Yes     Comment: 6 pack/week     Allergies   Codeine   Review of Systems Review of Systems  Constitutional: Negative for unexpected weight change.  Gastrointestinal: Negative for abdominal pain, nausea and vomiting.  Genitourinary: Negative for difficulty urinating, dysuria, frequency and hematuria.  Musculoskeletal: Positive for back pain. Negative for gait problem.  Skin: Negative for rash and wound.  Neurological: Negative for weakness and numbness.     Physical Exam Updated Vital Signs BP 139/89   Pulse 88   Temp 98.2 F (36.8 C) (Oral)   Resp 18   SpO2 99%   Physical Exam  Constitutional: He appears well-developed and well-nourished. No distress.  Nontoxic appearing.  HENT:  Head: Normocephalic and atraumatic.  Eyes: Conjunctivae are normal. Pupils are equal, round, and reactive to light. Right eye exhibits no discharge. Left eye exhibits no discharge.  Neck: Neck supple.  Cardiovascular: Normal rate, regular rhythm, normal heart sounds and intact distal pulses.   Pulmonary/Chest: Effort normal and breath sounds normal. No respiratory distress.  Abdominal: Soft. There is no tenderness.  Musculoskeletal: Normal range of motion. He exhibits tenderness. He exhibits no edema or deformity.  Mild tenderness across his bilateral low back musculature. No  midline neck or back tenderness. No back erythema, deformity, ecchymosis or warmth. No step-offs. Good strength to his bilateral lower extremities.  Lymphadenopathy:    He has no cervical adenopathy.  Neurological: He is alert. He displays normal reflexes. No sensory deficit. Coordination normal.  Normal KUB. Sensation is intact in his bilateral lower extremities. Bilateral  patellar DTRs are intact.  Skin: Skin is warm and dry. Capillary refill takes less than 2 seconds. No rash noted. He is not diaphoretic. No erythema. No pallor.  Psychiatric: He has a normal mood and affect. His behavior is normal.  Nursing note and vitals reviewed.    ED Treatments / Results   DIAGNOSTIC STUDIES: Oxygen Saturation is 99% on RA, normal by my interpretation.   COORDINATION OF CARE: 11:20 AM-Discussed next steps with pt. Pt verbalized understanding and is agreeable with the plan.    Labs (all labs ordered are listed, but only abnormal results are displayed) Labs Reviewed - No data to display  EKG  EKG Interpretation None       Radiology No results found.  Procedures Procedures (including critical care time)  Medications Ordered in ED Medications - No data to display   Initial Impression / Assessment and Plan / ED Course  I have reviewed the triage vital signs and the nursing notes.  Pertinent labs & imaging results that were available during my care of the patient were reviewed by me and considered in my medical decision making (see chart for details).    This  is a 51 y.o. male who presents to the Emergency Department complaining of chronic bilateral lower back pain that began months ago but worsened a few days ago. Pt denies injury.  He states that standing up and movement exacerbates pain. He reports lots of bending at his job.  Patient with bilateral low back pain.  No neurological deficits and normal neuro exam.  Normal gait. He is afebrile and non-toxic appearing.   No loss of bowel or bladder control.  No concern for cauda equina.  No fever, night sweats, weight loss, h/o cancer, IVDU.  RICE protocol and pain medicine indicated and discussed with patient.  I advised the patient to follow-up with their primary care provider this week. I advised the patient to return to the emergency department with new or worsening symptoms or new concerns. The  patient verbalized understanding and agreement with plan.    Final Clinical Impressions(s) / ED Diagnoses   Final diagnoses:  Acute bilateral low back pain without sciatica    New Prescriptions New Prescriptions   METHOCARBAMOL (ROBAXIN) 500 MG TABLET    Take 1 tablet (500 mg total) by mouth 2 (two) times daily as needed for muscle spasms.   NAPROXEN (NAPROSYN) 500 MG TABLET    Take 1 tablet (500 mg total) by mouth 2 (two) times daily with a meal.   I personally performed the services described in this documentation, which was scribed in my presence. The recorded information has been reviewed and is accurate.      Everlene FarrierDansie, Mosiah Bastin, PA-C 04/29/17 1132    Rolland PorterJames, Mark, MD 05/03/17 [phone]

## 2017-06-11 ENCOUNTER — Encounter (HOSPITAL_COMMUNITY): Payer: Self-pay

## 2017-06-11 DIAGNOSIS — F17210 Nicotine dependence, cigarettes, uncomplicated: Secondary | ICD-10-CM | POA: Insufficient documentation

## 2017-06-11 DIAGNOSIS — M545 Low back pain: Secondary | ICD-10-CM | POA: Insufficient documentation

## 2017-06-11 DIAGNOSIS — Z79899 Other long term (current) drug therapy: Secondary | ICD-10-CM | POA: Insufficient documentation

## 2017-06-11 NOTE — ED Triage Notes (Signed)
Pt reports 8/10 Mid Back Pain >6 months. Pt reports taking OTCs w/o relief. Pt denies hx of kidney stones. Pt denies incontinence and dysuria. Pt A+OX4, speaking in complete sentences, ambulatory to triage.

## 2017-06-12 ENCOUNTER — Emergency Department (HOSPITAL_COMMUNITY)
Admission: EM | Admit: 2017-06-12 | Discharge: 2017-06-12 | Disposition: A | Discharge status: Home / Self Care | Payer: Self-pay | Attending: Emergency Medicine | Admitting: Emergency Medicine

## 2017-06-12 DIAGNOSIS — G8929 Other chronic pain: Secondary | ICD-10-CM

## 2017-06-12 DIAGNOSIS — M545 Low back pain: Secondary | ICD-10-CM

## 2017-06-12 MED ORDER — KETOROLAC TROMETHAMINE 30 MG/ML IJ SOLN
60.0000 mg | Freq: Once | INTRAMUSCULAR | Status: AC
  Administered 2017-06-12: 60 mg via INTRAMUSCULAR
  Filled 2017-06-12: qty 2

## 2017-06-12 MED ORDER — LIDOCAINE 5 % EX PTCH: 1.0000 | MEDICATED_PATCH | CUTANEOUS | 0 refills | Status: DC

## 2017-06-12 MED ORDER — METHOCARBAMOL 500 MG PO TABS: 500.0000 mg | ORAL_TABLET | Freq: Two times a day (BID) | ORAL | 0 refills | Status: DC | PRN

## 2017-06-12 MED ORDER — PREDNISONE 20 MG PO TABS: 40.0000 mg | ORAL_TABLET | Freq: Every day | ORAL | 0 refills | Status: DC

## 2017-06-12 MED ORDER — METHOCARBAMOL 500 MG PO TABS
500.0000 mg | ORAL_TABLET | Freq: Once | ORAL | Status: AC
  Administered 2017-06-12: 500 mg via ORAL
  Filled 2017-06-12: qty 1

## 2017-06-12 MED ORDER — OXYCODONE-ACETAMINOPHEN 5-325 MG PO TABS
1.0000 | ORAL_TABLET | Freq: Once | ORAL | Status: AC
  Administered 2017-06-12: 1 via ORAL
  Filled 2017-06-12: qty 1

## 2017-06-12 MED ORDER — DEXAMETHASONE SODIUM PHOSPHATE 10 MG/ML IJ SOLN
10.0000 mg | Freq: Once | INTRAMUSCULAR | Status: AC
  Administered 2017-06-12: 10 mg via INTRAMUSCULAR
  Filled 2017-06-12: qty 1

## 2017-06-12 MED ORDER — LIDOCAINE 5 % EX PTCH
1.0000 | MEDICATED_PATCH | CUTANEOUS | Status: DC
  Administered 2017-06-12: 1 via TRANSDERMAL
  Filled 2017-06-12: qty 1

## 2017-06-12 NOTE — Discharge Instructions (Signed)
Take prednisone as prescribed and use Lidoderm patches for pain. You may also use Robaxin for muscle spasms. Alternate ice and heat to your low back. You may return, as needed, for new or worsening symptoms.

## 2017-06-12 NOTE — ED Provider Notes (Signed)
[facility]-EMERGENCY DEPT Provider Note   CSN: [id number] Arrival date & time: 06/11/17  2256     History   Chief Complaint Chief Complaint  Patient presents with  . Back Pain    Chronic    HPI Joseph [name] is a 51 y.o. male.  The history is provided by the patient. No language interpreter was used.  Back Pain   This is a recurrent problem. Episode onset: 6 months ago; acute exacerbation began tonight. The problem has been gradually worsening. The pain is associated with no known injury. The pain is present in the lumbar spine. The quality of the pain is described as aching. The pain does not radiate. The pain is moderate. The symptoms are aggravated by bending (and ambulation). Pertinent negatives include no fever, no numbness, no bowel incontinence, no perianal numbness, no bladder incontinence, no tingling and no weakness. He has tried NSAIDs for the symptoms. The treatment provided no relief. Risk factors: Hx of frequent lifting and bending at job.    Past Medical History:  Diagnosis Date  . Hypertension     There are no active problems to display for this patient.   History reviewed. No pertinent surgical history.     Home Medications    Prior to Admission medications   Medication Sig Start Date End Date Taking? Authorizing Provider  dicyclomine (BENTYL) 20 MG tablet Take 1 tablet (20 mg total) by mouth 2 (two) times daily. 10/12/16   Rolan BuccoBelfi, Melanie, MD  lidocaine (LIDODERM) 5 % Place 1 patch onto the skin daily. Remove & Discard patch within 12 hours or as directed by MD 06/12/17   Antony MaduraHumes, Amias Hutchinson, PA-C  methocarbamol (ROBAXIN) 500 MG tablet Take 1 tablet (500 mg total) by mouth 2 (two) times daily as needed for muscle spasms. 06/12/17   Antony MaduraHumes, Rashay Barnette, PA-C  naproxen (NAPROSYN) 500 MG tablet Take 1 tablet (500 mg total) by mouth 2 (two) times daily with a meal. 04/29/17   Everlene Farrieransie, William, PA-C  ondansetron (ZOFRAN ODT) 4 MG disintegrating tablet 4mg  ODT q4 hours prn  nausea/vomit 10/12/16   Rolan BuccoBelfi, Melanie, MD  predniSONE (DELTASONE) 20 MG tablet Take 2 tablets (40 mg total) by mouth daily. Take 40 mg by mouth daily for 3 days, then 20mg  by mouth daily for 3 days, then 10mg  daily for 3 days 06/12/17   Antony MaduraHumes, Rileyann Florance, PA-C    Family History History reviewed. No pertinent family history.  Social History Social History  Substance Use Topics  . Smoking status: Current Every Day Smoker    Packs/day: 0.10    Years: 25.00    Types: Cigarettes  . Smokeless tobacco: Current User  . Alcohol use Yes     Comment: 6 pack/week     Allergies   Codeine   Review of Systems Review of Systems  Constitutional: Negative for fever.  Gastrointestinal: Negative for bowel incontinence.  Genitourinary: Negative for bladder incontinence.  Musculoskeletal: Positive for back pain.  Neurological: Negative for tingling, weakness and numbness.  Ten systems reviewed and are negative for acute change, except as noted in the HPI.    Physical Exam Updated Vital Signs BP (!) 151/100 (BP Location: Left Arm)   Pulse (!) 104   Temp 98.1 F (36.7 C) (Oral)   Resp 18   Ht 5\' 10"  (1.778 m)   Wt 97.1 kg (214 lb)   SpO2 98%   BMI 30.71 kg/m   Physical Exam  Constitutional: He is oriented to person, place, and time. He  appears well-developed and well-nourished. No distress.  Nontoxic and in NAD  HENT:  Head: Normocephalic and atraumatic.  Eyes: Conjunctivae and EOM are normal. No scleral icterus.  Neck: Normal range of motion.  Cardiovascular: Normal rate, regular rhythm and intact distal pulses.   DP and PT pulses 2+ bilaterally.  Pulmonary/Chest: Effort normal. No respiratory distress. He has no wheezes.  Respirations even and unlabored  Musculoskeletal: Normal range of motion.       Lumbar back: He exhibits tenderness and pain. He exhibits no laceration and normal pulse.       Back:  TTP to lumbar paraspinal muscles on the left. No bony deformities, step offs, or  crepitus to the lumbosacral midline.  Neurological: He is alert and oriented to person, place, and time. He exhibits normal muscle tone. Coordination normal.  Ambulatory with antalgic gait. Sensation to light touch intact in BLE.  Skin: Skin is warm and dry. No rash noted. He is not diaphoretic. No erythema. No pallor.  Psychiatric: He has a normal mood and affect. His behavior is normal.  Nursing note and vitals reviewed.    ED Treatments / Results  Labs (all labs ordered are listed, but only abnormal results are displayed) Labs Reviewed - No data to display  EKG  EKG Interpretation None       Radiology No results found.  Procedures Procedures (including critical care time)  Medications Ordered in ED Medications  ketorolac (TORADOL) 30 MG/ML injection 60 mg (not administered)  lidocaine (LIDODERM) 5 % 1 patch (not administered)  dexamethasone (DECADRON) injection 10 mg (not administered)  oxyCODONE-acetaminophen (PERCOCET/ROXICET) 5-325 MG per tablet 1 tablet (not administered)  methocarbamol (ROBAXIN) tablet 500 mg (not administered)     Initial Impression / Assessment and Plan / ED Course  I have reviewed the triage vital signs and the nursing notes.  Pertinent labs & imaging results that were available during my care of the patient were reviewed by me and considered in my medical decision making (see chart for details).     Patient with back pain. Hx of similar symptoms with 2 prior ED presentations. Patient neurovascularly intact. Patient can walk but states is painful. He has been ambulatory in the ED independently. No loss of bowel or bladder control. No concern for cauda equina. No fever, hx of CA or hx of IVDU. RICE protocol and pain medicine indicated and discussed with patient. Return precautions provided. Patient discharged in stable condition with no unaddressed concerns.   Final Clinical Impressions(s) / ED Diagnoses   Final diagnoses:  Acute  exacerbation of chronic low back pain    New Prescriptions New Prescriptions   LIDOCAINE (LIDODERM) 5 %    Place 1 patch onto the skin daily. Remove & Discard patch within 12 hours or as directed by MD   PREDNISONE (DELTASONE) 20 MG TABLET    Take 2 tablets (40 mg total) by mouth daily. Take 40 mg by mouth daily for 3 days, then 20mg  by mouth daily for 3 days, then 10mg  daily for 3 days     Antony MaduraHumes, Elijiah Mickley, Cordelia Poche-C 06/12/17 0432    Azalia Bilisampos, Kevin, MD 06/12/17 [phone]

## 2017-07-26 ENCOUNTER — Emergency Department (HOSPITAL_COMMUNITY): Payer: Self-pay

## 2017-07-26 ENCOUNTER — Emergency Department (HOSPITAL_COMMUNITY)
Admission: EM | Admit: 2017-07-26 | Discharge: 2017-07-26 | Disposition: A | Discharge status: Home / Self Care | Payer: Self-pay | Attending: Emergency Medicine | Admitting: Emergency Medicine

## 2017-07-26 ENCOUNTER — Encounter (HOSPITAL_COMMUNITY): Payer: Self-pay

## 2017-07-26 DIAGNOSIS — Y939 Activity, unspecified: Secondary | ICD-10-CM | POA: Insufficient documentation

## 2017-07-26 DIAGNOSIS — Y929 Unspecified place or not applicable: Secondary | ICD-10-CM | POA: Insufficient documentation

## 2017-07-26 DIAGNOSIS — F17210 Nicotine dependence, cigarettes, uncomplicated: Secondary | ICD-10-CM | POA: Insufficient documentation

## 2017-07-26 DIAGNOSIS — X58XXXA Exposure to other specified factors, initial encounter: Secondary | ICD-10-CM | POA: Insufficient documentation

## 2017-07-26 DIAGNOSIS — Y999 Unspecified external cause status: Secondary | ICD-10-CM | POA: Insufficient documentation

## 2017-07-26 DIAGNOSIS — S39012A Strain of muscle, fascia and tendon of lower back, initial encounter: Secondary | ICD-10-CM | POA: Insufficient documentation

## 2017-07-26 MED ORDER — METHOCARBAMOL 500 MG PO TABS: 500.0000 mg | ORAL_TABLET | Freq: Three times a day (TID) | ORAL | 0 refills | Status: AC

## 2017-07-26 MED ORDER — KETOROLAC TROMETHAMINE 60 MG/2ML IM SOLN
60.0000 mg | Freq: Once | INTRAMUSCULAR | Status: AC
  Administered 2017-07-26: 60 mg via INTRAMUSCULAR
  Filled 2017-07-26: qty 2

## 2017-07-26 MED ORDER — OXYCODONE-ACETAMINOPHEN 5-325 MG PO TABS
2.0000 | ORAL_TABLET | Freq: Once | ORAL | Status: AC
  Administered 2017-07-26: 2 via ORAL
  Filled 2017-07-26: qty 2

## 2017-07-26 MED ORDER — NAPROXEN 500 MG PO TABS: 500.0000 mg | ORAL_TABLET | Freq: Two times a day (BID) | ORAL | 0 refills | Status: AC

## 2017-07-26 NOTE — ED Provider Notes (Signed)
[facility]-EMERGENCY DEPT Provider Note   CSN: [id number] Arrival date & time: 07/26/17  0701     History   Chief Complaint Chief Complaint  Patient presents with  . Back Pain  . Neck Pain    HPI Joseph [name] is a 51 y.o. male.  HPI    51 year old male with history of hypertension here with back pain. The patient has a history of recurrent, chronic back pain. He works in a [employer] with frequent lifting and twisting. He states that over the last several days, he sat aching, throbbing, tightness sensation in his right paraspinal lower back area, as well as his neck. The pain is worse with bending forward and twisting. He denies any alleviating factors. He has not taken any regular medications for this. Denies any lower extremity numbness or weakness. No recent falls. No fevers, chills, or weight loss. No history of IV drug use.  Past Medical History:  Diagnosis Date  . Hypertension     There are no active problems to display for this patient.   History reviewed. No pertinent surgical history.     Home Medications    Prior to Admission medications   Medication Sig Start Date End Date Taking? Authorizing Provider  dicyclomine (BENTYL) 20 MG tablet Take 1 tablet (20 mg total) by mouth 2 (two) times daily. 10/12/16   Rolan Bucco, MD  lidocaine (LIDODERM) 5 % Place 1 patch onto the skin daily. Remove & Discard patch within 12 hours or as directed by MD 06/12/17   Antony Madura, PA-C  methocarbamol (ROBAXIN) 500 MG tablet Take 1 tablet (500 mg total) by mouth 3 (three) times daily. 07/26/17 08/02/17  Shaune Pollack, MD  naproxen (NAPROSYN) 500 MG tablet Take 1 tablet (500 mg total) by mouth 2 (two) times daily with a meal. 07/26/17 08/02/17  Shaune Pollack, MD  ondansetron (ZOFRAN ODT) 4 MG disintegrating tablet  ODT q4 hours prn nausea/vomit 10/12/16   Rolan Bucco, MD  predniSONE (DELTASONE) 20 MG tablet Take 2 tablets (40 mg total) by mouth daily. Take 40 mg by mouth  daily for 3 days, then  by mouth daily for 3 days, then  daily for 3 days 06/12/17   Antony Madura, PA-C    Family History Family History  Problem Relation Age of Onset  . Hypertension Father     Social History Social History  Substance Use Topics  . Smoking status: Current Every Day Smoker    Packs/day: 0.10    Years: 25.00    Types: Cigarettes  . Smokeless tobacco: Never Used  . Alcohol use Yes     Comment: 6 pack/week     Allergies   Codeine   Review of Systems Review of Systems  Constitutional: Negative for chills, fatigue and fever.  HENT: Negative for congestion and rhinorrhea.   Eyes: Negative for visual disturbance.  Respiratory: Negative for cough, shortness of breath and wheezing.   Cardiovascular: Negative for chest pain and leg swelling.  Gastrointestinal: Negative for abdominal pain, diarrhea, nausea and vomiting.  Genitourinary: Negative for dysuria and flank pain.  Musculoskeletal: Positive for arthralgias, back pain and neck pain. Negative for neck stiffness.  Skin: Negative for rash and wound.  Allergic/Immunologic: Negative for immunocompromised state.  Neurological: Negative for syncope, weakness and headaches.  All other systems reviewed and are negative.    Physical Exam Updated Vital Signs BP 115/79 (BP Location: Left Arm)   Pulse 75   Temp 97.6 F (36.4 C) (Oral)   Resp  14   Ht 5\' 10"  (1.778 m)   Wt 98 kg (216 lb)   SpO2 98%   BMI 30.99 kg/m   Physical Exam  Constitutional: He is oriented to person, place, and time. He appears well-developed and well-nourished. No distress.  HENT:  Head: Normocephalic and atraumatic.  Eyes: Conjunctivae are normal.  Neck: Neck supple.  Cardiovascular: Normal rate, regular rhythm and normal heart sounds.  Exam reveals no friction rub.   No murmur heard. Pulmonary/Chest: Effort normal and breath sounds normal. No respiratory distress. He has no wheezes. He has no rales.  Abdominal: He  exhibits no distension.  Musculoskeletal: He exhibits no edema.  Neurological: He is alert and oriented to person, place, and time. He exhibits normal muscle tone.  Skin: Skin is warm. Capillary refill takes less than 2 seconds.  Psychiatric: He has a normal mood and affect.  Nursing note and vitals reviewed.   Spine Exam: Inspection/Palpation: Moderate TTP over midline and right paraspinal musculature, without spasm or deformity. Mild TTP over right paracervical musculature. Strength: 5/5 throughout LE bilaterally (hip flexion/extension, adduction/abduction; knee flexion/extension; foot dorsiflexion/plantarflexion, inversion/eversion; great toe inversion) Sensation: Intact to light touch in proximal and distal LE bilaterally Reflexes: 2+ quadriceps and achilles reflexes  ED Treatments / Results  Labs (all labs ordered are listed, but only abnormal results are displayed) Labs Reviewed - No data to display  EKG  EKG Interpretation None       Radiology Dg Lumbar Spine Complete  Result Date: 07/26/2017 CLINICAL DATA:  Low back pain for 10 months. EXAM: LUMBAR SPINE - COMPLETE 4+ VIEW COMPARISON:  None. FINDINGS: There is no evidence of lumbar spine fracture or bony lesion. Alignment is normal. Intervertebral disc spaces are maintained and there is no evidence of significant degenerative disc disease. IMPRESSION: Normal lumbar spine radiographs. Electronically Signed   By: Irish LackGlenn  Yamagata M.D.   On: 07/26/2017 08:26    Procedures Procedures (including critical care time)  Medications Ordered in ED Medications  oxyCODONE-acetaminophen (PERCOCET/ROXICET) 5-325 MG per tablet 2 tablet (2 tablets Oral Given 07/26/17 0801)  ketorolac (TORADOL) injection 60 mg (60 mg Intramuscular Given 07/26/17 0759)     Initial Impression / Assessment and Plan / ED Course  I have reviewed the triage vital signs and the nursing notes.  Pertinent labs & imaging results that were available during my  care of the patient were reviewed by me and considered in my medical decision making (see chart for details).    51 year old male with past medical history as above here with reproducible, paraspinal back pain that is worse with palpation and twisting. I suspect he has ongoing musculoskeletal pain secondary to paraspinal strain. He has no evidence to suggest radiculopathy. No red flags including fevers, chills, weight loss, history of IV drug use, or other symptoms to suggest infectious etiology. No evidence of cauda equina. He feels improved with analgesia here. Will discharge home on muscle relaxants, anti-inflammatories, and outpatient follow-up.  This note was prepared with assistance of Conservation officer, historic buildingsDragon voice recognition software. Occasional wrong-word or sound-a-like substitutions may have occurred due to the inherent limitations of voice recognition software.   Final Clinical Impressions(s) / ED Diagnoses   Final diagnoses:  Strain of lumbar region, initial encounter    New Prescriptions Discharge Medication List as of 07/26/2017  9:17 AM       Shaune PollackIsaacs, Marvin Grabill, MD 07/26/17 2010

## 2017-07-26 NOTE — ED Triage Notes (Signed)
Patient reports that he has had mid  And left lower back pain since Friday. Patient also c/o posterior neck pain that radiates into the left scapula  since last night. Patient denies any numbness or tingling.

## 2017-07-26 NOTE — ED Notes (Signed)
Pt stated he is able to get a ride home.

## 2017-09-28 IMAGING — CR DG CHEST 2V
2 series · 2 of 2 positions shown · non-contrast
Comparison: 09/14/2015 .

CLINICAL DATA: Chest pain.

EXAM:
CHEST  2 VIEW

[w chest pa]
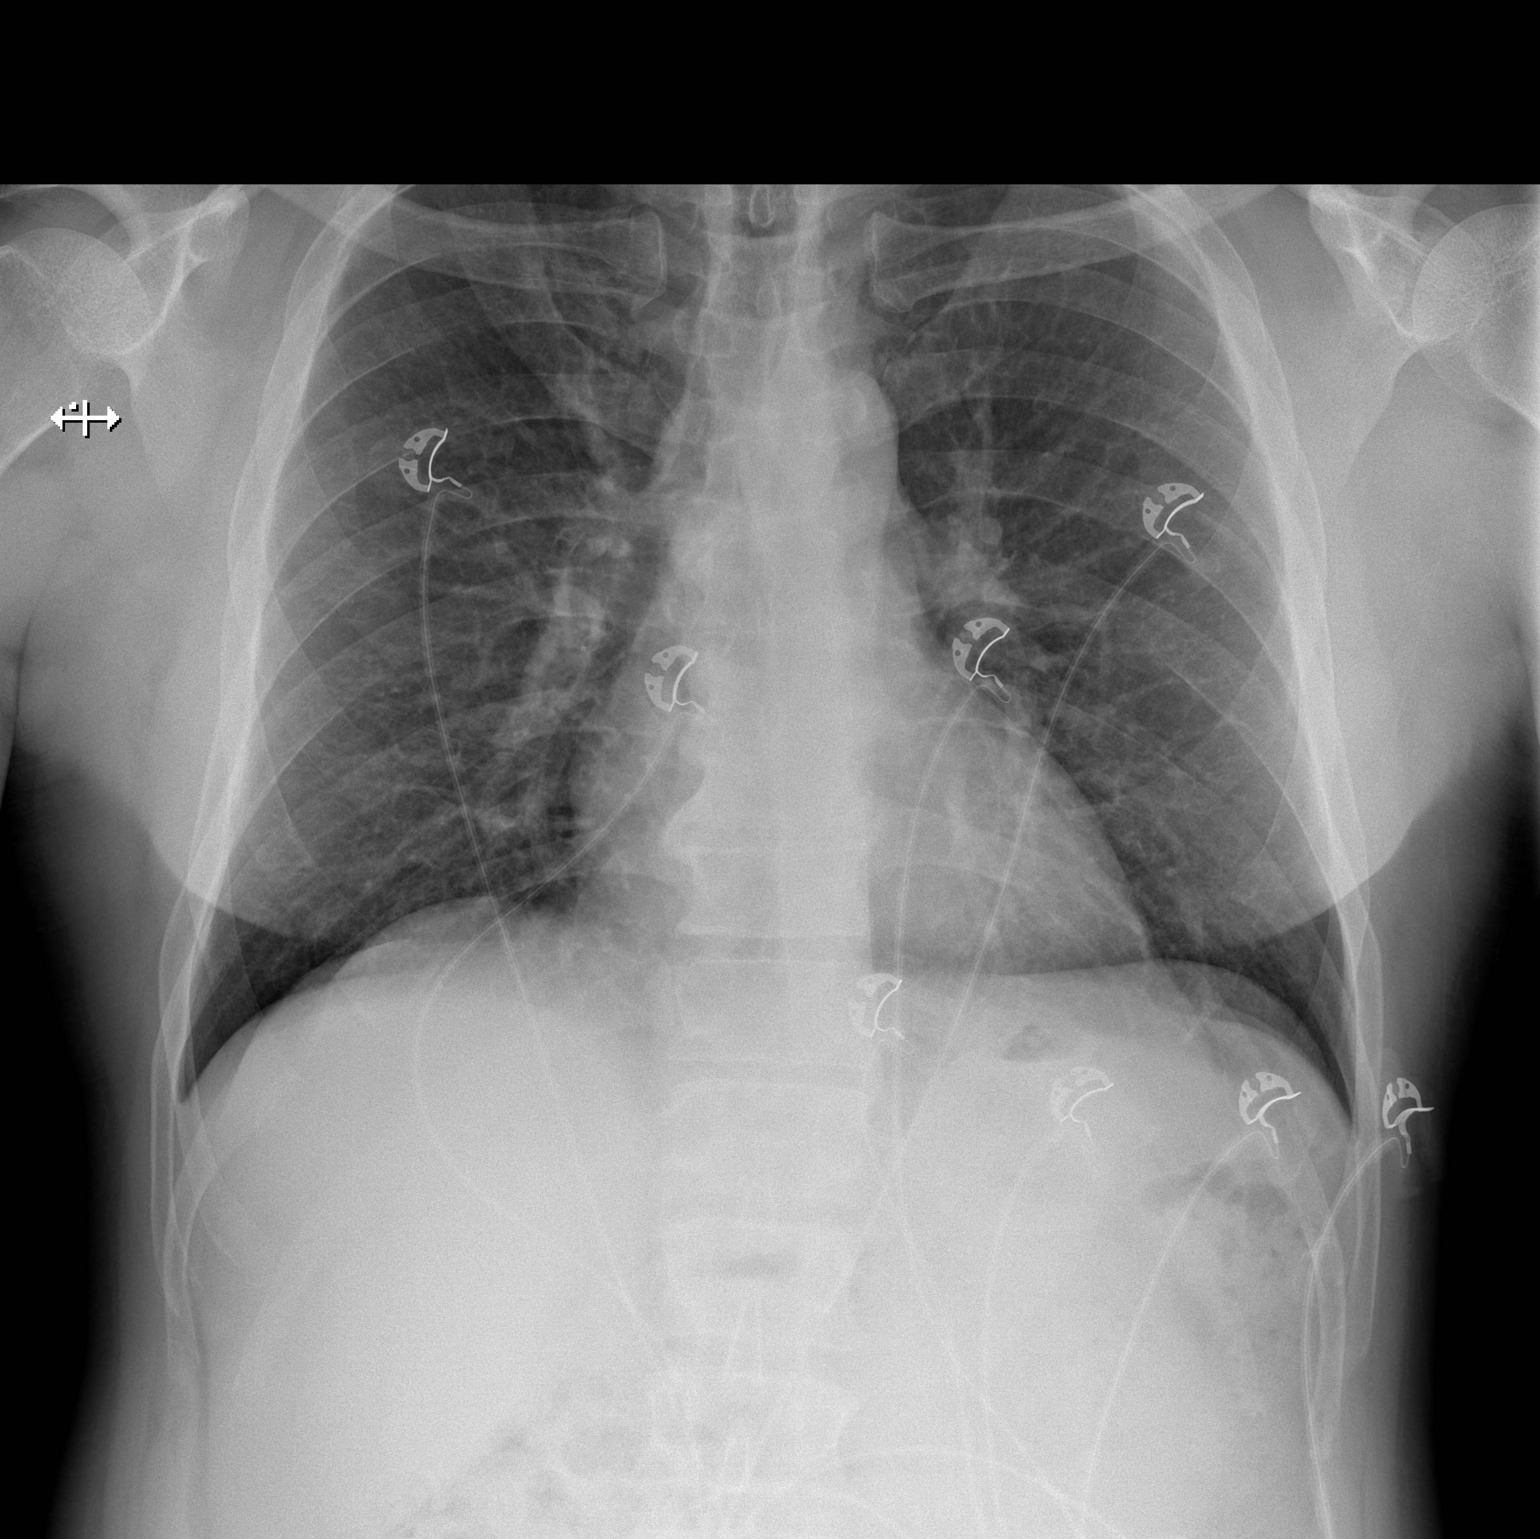

[w chest lat]
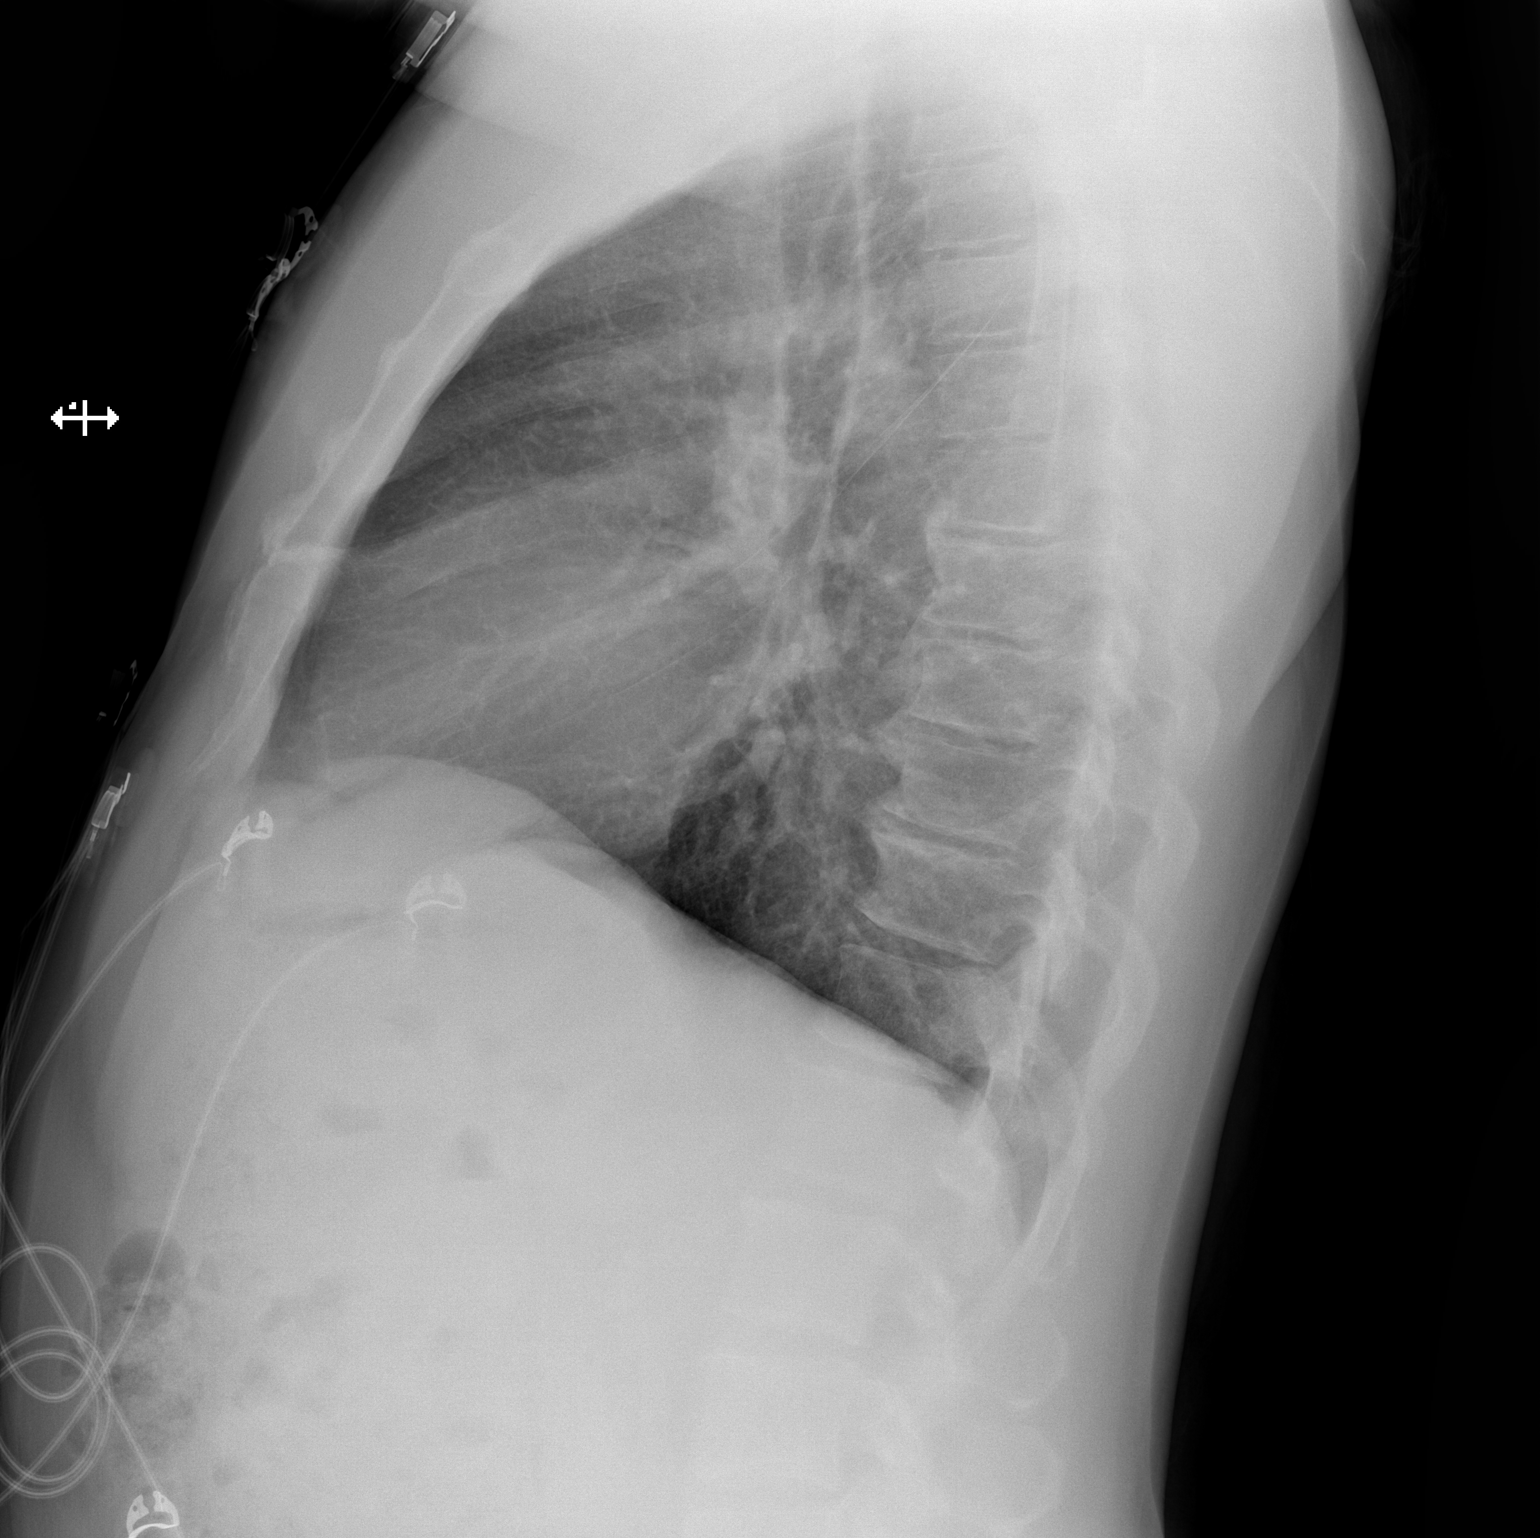

[2 of 2 positions shown; findings below may reference images not displayed]

FINDINGS: Mediastinum hilar structures normal. Lungs are clear. Heart size
normal. No pleural effusion or pneumothorax. No acute bony
abnormality. Degenerative changes thoracic spine
IMPRESSION: No acute cardiopulmonary disease.

## 2017-11-23 IMAGING — CR DG CHEST 2V
2 series · 2 of 2 positions shown · non-contrast
Comparison: 08/17/2016

CLINICAL DATA: Cough congestion and abdominal pain.

EXAM:
CHEST  2 VIEW

[w chest pa]
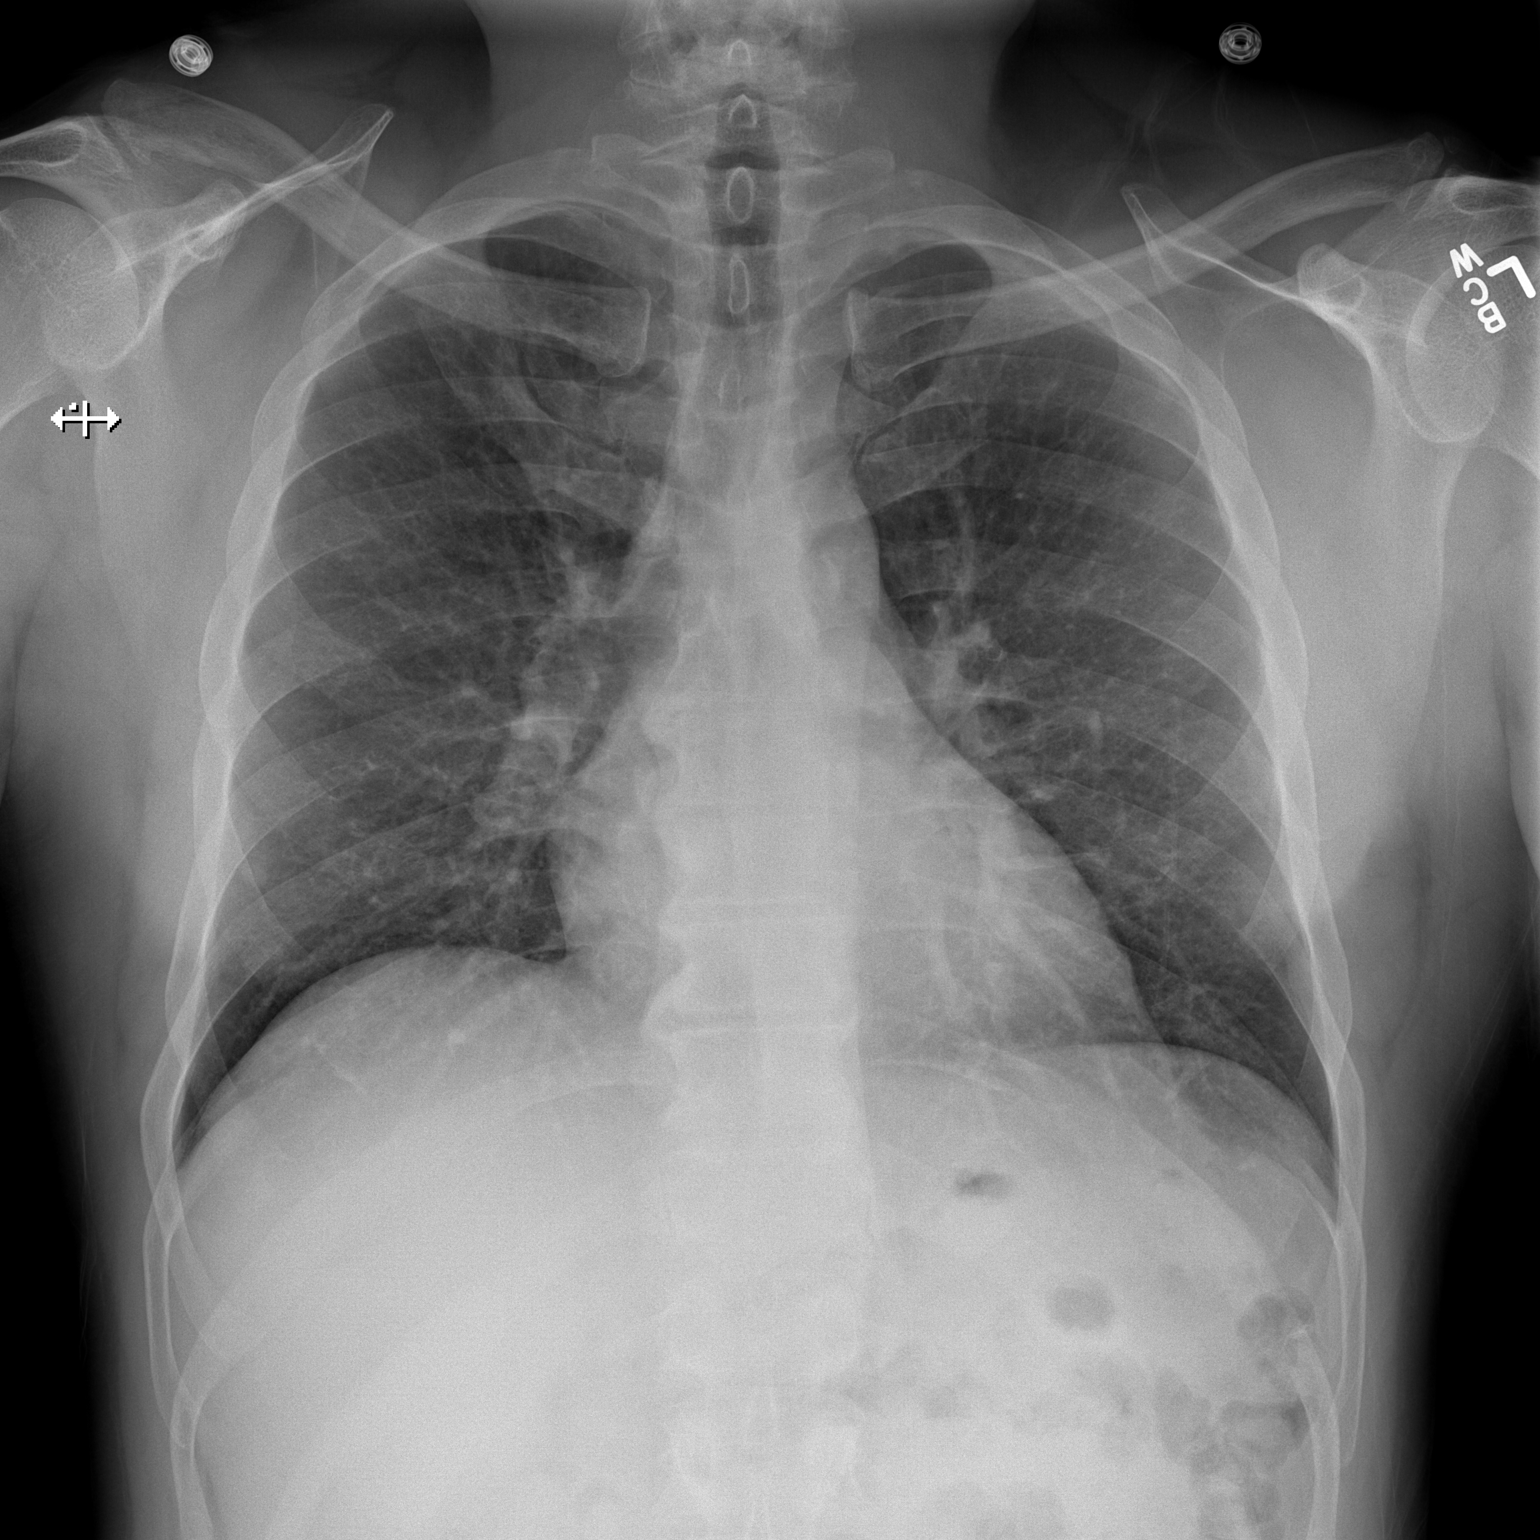

[w chest lat]
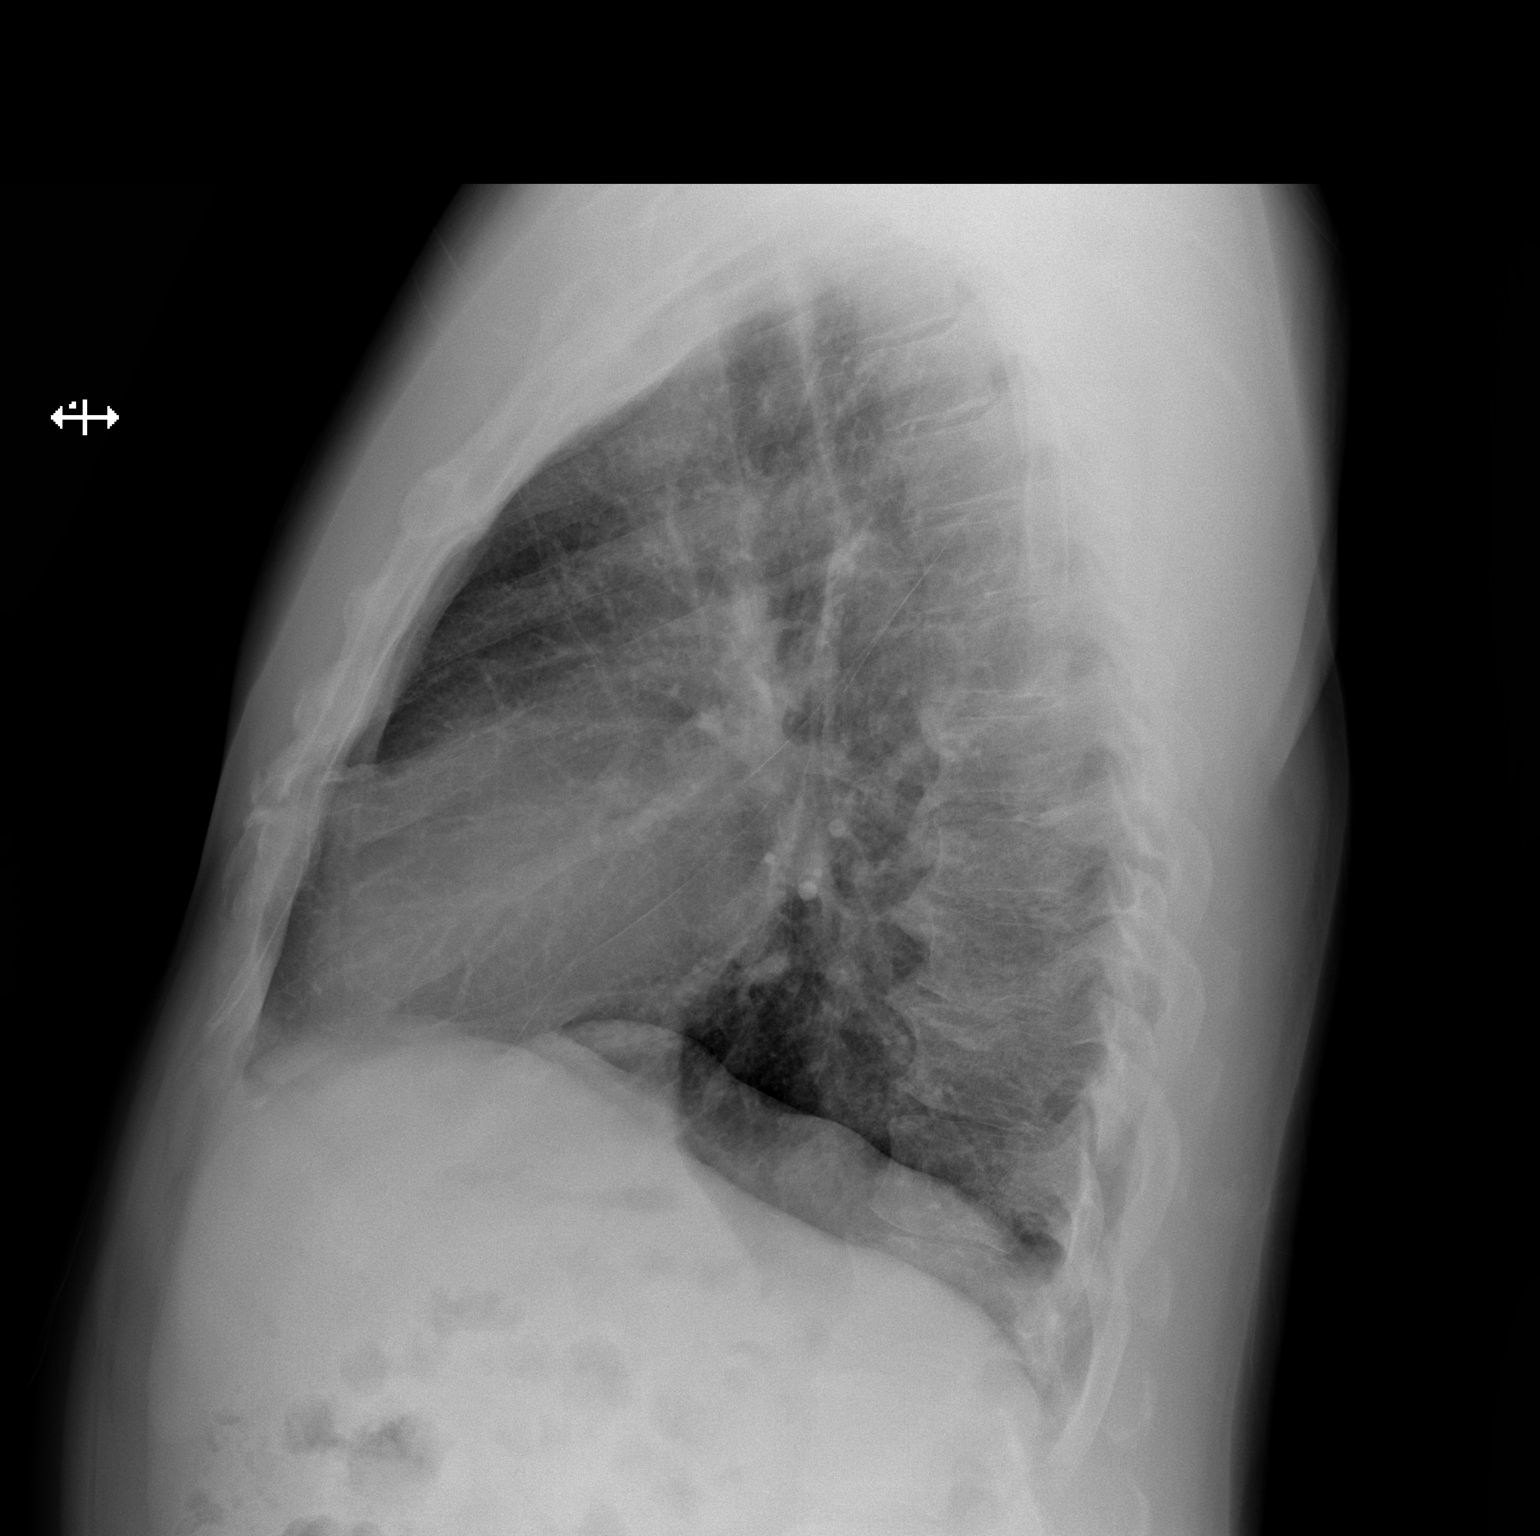

[2 of 2 positions shown; findings below may reference images not displayed]

FINDINGS: The heart size and mediastinal contours are within normal limits.
Both lungs are clear. The visualized skeletal structures are
unremarkable.
IMPRESSION: No active cardiopulmonary disease.

## 2018-01-13 ENCOUNTER — Encounter (HOSPITAL_COMMUNITY): Payer: Self-pay | Admitting: *Deleted

## 2018-01-13 ENCOUNTER — Emergency Department (HOSPITAL_COMMUNITY)
Admission: EM | Admit: 2018-01-13 | Discharge: 2018-01-13 | Disposition: A | Discharge status: Home / Self Care | Payer: Self-pay | Attending: Emergency Medicine | Admitting: Emergency Medicine

## 2018-01-13 DIAGNOSIS — M545 Low back pain, unspecified: Secondary | ICD-10-CM

## 2018-01-13 DIAGNOSIS — F17210 Nicotine dependence, cigarettes, uncomplicated: Secondary | ICD-10-CM | POA: Insufficient documentation

## 2018-01-13 DIAGNOSIS — I10 Essential (primary) hypertension: Secondary | ICD-10-CM | POA: Insufficient documentation

## 2018-01-13 MED ORDER — NAPROXEN 500 MG PO TABS: 500.0000 mg | ORAL_TABLET | Freq: Two times a day (BID) | ORAL | 0 refills | Status: DC

## 2018-01-13 MED ORDER — METHOCARBAMOL 500 MG PO TABS: 500.0000 mg | ORAL_TABLET | Freq: Three times a day (TID) | ORAL | 0 refills | Status: DC | PRN

## 2018-01-13 NOTE — ED Triage Notes (Signed)
Pt complains of back pain x 2 days, pain is worse with standing. Pt has tried OTC medication w/o relief.

## 2018-01-13 NOTE — ED Provider Notes (Signed)
[facility]-EMERGENCY DEPT Provider Note   CSN: [id number] Arrival date & time: 01/13/18  [id number]     History   Chief Complaint Chief Complaint  Patient presents with  . Back Pain    HPI Joseph [name] is a 52 y.o. male with a history of hypertension and tobacco abuse who presents to the emergency department complaining of back pain for the past 2 days.  Patient describes the pain as being to his left lower back.  States this is a constant pain, at present it is an 8 out of 10 in severity, worse with twisting and bending over, minimally improved with Tylenol.  Describes it as a tight/spasm/nagging type pain.  States he does a lot of standing and movement at work and believe this prompted the pain. Denies numbness, tingling, weakness, incontinence to bowel/bladder, fever, chills, IV drug use, or hx of cancer.   HPI  Past Medical History:  Diagnosis Date  . Hypertension     There are no active problems to display for this patient.   History reviewed. No pertinent surgical history.     Home Medications    Prior to Admission medications   Medication Sig Start Date End Date Taking? Authorizing Provider  dicyclomine (BENTYL) 20 MG tablet Take 1 tablet (20 mg total) by mouth 2 (two) times daily. 10/12/16   Rolan BuccoBelfi, Melanie, MD  lidocaine (LIDODERM) 5 % Place 1 patch onto the skin daily. Remove & Discard patch within 12 hours or as directed by MD 06/12/17   Antony MaduraHumes, Kelly, PA-C  ondansetron (ZOFRAN ODT) 4 MG disintegrating tablet 4mg  ODT q4 hours prn nausea/vomit 10/12/16   Rolan BuccoBelfi, Melanie, MD  predniSONE (DELTASONE) 20 MG tablet Take 2 tablets (40 mg total) by mouth daily. Take 40 mg by mouth daily for 3 days, then 20mg  by mouth daily for 3 days, then 10mg  daily for 3 days 06/12/17   Antony MaduraHumes, Kelly, PA-C    Family History Family History  Problem Relation Age of Onset  . Hypertension Father     Social History Social History   Tobacco Use  . Smoking status:  Current Every Day Smoker    Packs/day: 0.10    Years: 25.00    Pack years: 2.50    Types: Cigarettes  . Smokeless tobacco: Never Used  Substance Use Topics  . Alcohol use: Yes    Comment: 6 pack/week  . Drug use: Yes    Types: Marijuana     Allergies   Codeine   Review of Systems Review of Systems  Constitutional: Negative for chills, fever and unexpected weight change.  Musculoskeletal: Positive for back pain.  Neurological: Negative for weakness and numbness.       Negative for incontinence or saddle anesthesia.    Physical Exam Updated Vital Signs BP (!) 139/100 (BP Location: Right Arm)   Pulse 97   Temp 98.1 F (36.7 C) (Oral)   Resp 18   SpO2 100%   Physical Exam  Constitutional: He appears well-developed and well-nourished. No distress.  HENT:  Head: Normocephalic and atraumatic.  Eyes: Conjunctivae are normal. Right eye exhibits no discharge. Left eye exhibits no discharge.  Cardiovascular:  Pulses:      Posterior tibial pulses are 2+ on the right side, and 2+ on the left side.  Abdominal: Soft. He exhibits no distension. There is no tenderness. There is no CVA tenderness.  Musculoskeletal:  Back: No overlying erythema, warmth, ecchymosis, or obvious deformity.  Patient has no midline tenderness  to palpation.  There is left lumbar paraspinal muscle tenderness to palpation.  Neurological: He is alert.  Clear speech.  Sensation grossly intact bilateral lower extremities.  5 out of 5 strength with plantar and dorsiflexion bilaterally.  Patellar DTRs are 2+ and symmetric.  Gait is intact.  Psychiatric: He has a normal mood and affect. His behavior is normal. Thought content normal.  Nursing note and vitals reviewed.   ED Treatments / Results  Labs (all labs ordered are listed, but only abnormal results are displayed) Labs Reviewed - No data to display  EKG  EKG Interpretation None       Radiology No results found.  Procedures Procedures  (including critical care time)  Medications Ordered in ED Medications - No data to display   Initial Impression / Assessment and Plan / ED Course  I have reviewed the triage vital signs and the nursing notes.  Pertinent labs & imaging results that were available during my care of the patient were reviewed by me and considered in my medical decision making (see chart for details).    Patient presents with complaint of back pain.  Patient is nontoxic appearing, vitals WNL other than elevated blood pressure, no indication of HTN emergency, patient aware of need for recheck. Patient has no neurological deficits.There is no point vertebral tenderness.  Patient is able to ambulate.  No loss of bowel or bladder control. No saddle anesthesia.  No concern for cauda equina.  No fever, night sweats, weight loss, h/o cancer, or IVDU.  Will treat with Naproxen and Robaxin, discussed with patient that they are not to drive or operate heavy machinery while taking Robaxin. I discussed treatment plan, need for PCP follow-up, and return precautions with the patient. Provided opportunity for questions, patient confirmed understanding and is in agreement with plan.   Final Clinical Impressions(s) / ED Diagnoses   Final diagnoses:  Acute left-sided low back pain without sciatica    ED Discharge Orders        Ordered    naproxen (NAPROSYN) 500 MG tablet  2 times daily     01/13/18 1046    methocarbamol (ROBAXIN) 500 MG tablet  Every 8 hours PRN     01/13/18 1046       Laurali Goddard, [place] R, PA-C 01/13/18 1215    Mancel Bale, MD 01/13/18 1952

## 2018-01-13 NOTE — Discharge Instructions (Signed)
Back Pain:  I have prescribed you an anti-inflammatory medication and a muscle relaxer.   Naproxen is a nonsteroidal anti-inflammatory medication that will help with pain and swelling. Be sure to take this medication as prescribed with food, 1 pill every 12 hours,  It should be taken with food, as it can cause stomach upset, and more seriously, stomach bleeding. Do not take other nonsteroidal anti-inflammatory medications with this such as Advil, Motrin, or Aleve.   Robaxin is the muscle relaxer I have prescribed, this is meant to help with muscle tightness. Be aware that this medication may make you drowsy therefore the first time you take this it should be at a time you are in an environment where you can rest. Do not drive or operate heavy machinery when taking this medication.   In addition you may also take Tylenol. Tylenol is generally safe, though you should not take more than 8 of the extra strength (500mg ) pills a day.  The application of heat can help soothe the pain.  Maintaining your daily activities, including walking, is encourged, as it will help you get better faster than just staying in bed.  Low back pain is discomfort in the lower back that may be due to injuries to muscles and ligaments around the spine.  Occasionally, it may be caused by a a problem to a part of the spine called a disc.  The pain may last several days or a few weeks.   Your pain should get better over the next 1-2 weeks.  You will need to follow up with  Your primary healthcare provider in 1-2 weeks for reassessment, if you do not have a primary care provider one is provided in your discharge instructions. However if you develop severe or worsening pain, low back pain with fever, numbness, weakness, loss of bowel or bladder control, or inability to walk or urinate, you should return to the ER immediately.  Please follow up with your doctor this week for a recheck if still having symptoms.  Additionally your blood  pressure was elevated in the emergency department today, you will need to have this rechecked by your primary care provider as well. Vitals:   01/13/18 0938  BP: (!) 139/100  Pulse: 97  Resp: 18  Temp: 98.1 F (36.7 C)  SpO2: 100%

## 2018-01-17 ENCOUNTER — Encounter (HOSPITAL_COMMUNITY): Payer: Self-pay | Admitting: Family Medicine

## 2018-01-17 ENCOUNTER — Emergency Department (HOSPITAL_COMMUNITY)
Admission: EM | Admit: 2018-01-17 | Discharge: 2018-01-17 | Disposition: A | Discharge status: Home / Self Care | Payer: Self-pay | Attending: Emergency Medicine | Admitting: Emergency Medicine

## 2018-01-17 DIAGNOSIS — M545 Low back pain, unspecified: Secondary | ICD-10-CM

## 2018-01-17 DIAGNOSIS — I10 Essential (primary) hypertension: Secondary | ICD-10-CM | POA: Insufficient documentation

## 2018-01-17 DIAGNOSIS — M62830 Muscle spasm of back: Secondary | ICD-10-CM | POA: Insufficient documentation

## 2018-01-17 DIAGNOSIS — F17210 Nicotine dependence, cigarettes, uncomplicated: Secondary | ICD-10-CM | POA: Insufficient documentation

## 2018-01-17 DIAGNOSIS — Z79899 Other long term (current) drug therapy: Secondary | ICD-10-CM | POA: Insufficient documentation

## 2018-01-17 MED ORDER — CYCLOBENZAPRINE HCL 10 MG PO TABS: 10.0000 mg | ORAL_TABLET | Freq: Three times a day (TID) | ORAL | 0 refills | Status: DC | PRN

## 2018-01-17 MED ORDER — MELOXICAM 15 MG PO TABS: 15.0000 mg | ORAL_TABLET | Freq: Every day | ORAL | 0 refills | Status: DC

## 2018-01-17 NOTE — ED Triage Notes (Signed)
Patient is complaining of lower back pain that started January 11, 2017. Patient was seen on February 28 for lower back pain. Patient denies any recent injury. Only change from last visit is the lower back pain was to the left and now it is more in the center of the lower back. Denies any loss of bowel/bladder or numbness/tingling. Patient is ambulatory with a steady gait and [facility] while [facility].

## 2018-01-17 NOTE — ED Provider Notes (Signed)
[facility]-EMERGENCY DEPT Provider Note   CSN: [id number] Arrival date & time: 01/17/18  1202     History   Chief Complaint Chief Complaint  Patient presents with  . Back Pain    HPI Joseph [name] is a [age] y.o. male with a PMHx of HTN, who presents to the ED with complaints of ongoing lower back pain x1 week.  Chart review reveals he was seen in the ED on 01/13/18 for same complaints, no labs/imaging performed, was discharged with robaxin and naprosyn rx's.  The patient states that he has been taking his medications with no relief.  He states that his back pain initially started 1 week ago, and describes it as 8/10 constant sharp nonradiating lower back pain which worsens with prolonged standing and has been unrelieved with Robaxin, naproxen, Aleve, and ice.  He works at [employer] and therefore stands for 14hrs a day, and does some heavy lifting and repetitive bending at work.  He denies any injury or trauma.  He denies fevers, chills, CP, SOB, abd pain, N/V/D/C, hematuria, dysuria, incontinence of urine/stool, saddle anesthesia/cauda equina symptoms, myalgias, arthralgias, numbness, tingling, focal weakness, or any other complaints at this time.  He denies hx of CA or IVDU.  Of note, further chart review reveals that he was seen 5 times in 2018 for low back pain complaints; on his visit on 07/26/17 he had a lumbar xray which was negative and he was discharged with robaxin.  He has never seen an orthopedist for his back pain issues.    The history is provided by the patient and medical records. No language interpreter was used.  Back Pain   This is a recurrent problem. The current episode started more than 1 week ago. The problem occurs constantly. The problem has not changed since onset.The pain is associated with lifting heavy objects. The pain is present in the lumbar spine. Quality: sharp. The pain does not radiate. The pain is at a severity of 8/10. The pain is  moderate. Exacerbated by: prolonged standing. The pain is the same all the time. Pertinent negatives include no chest pain, no fever, no numbness, no abdominal pain, no bowel incontinence, no perianal numbness, no bladder incontinence, no dysuria, no paresthesias, no paresis, no tingling and no weakness. He has tried NSAIDs, muscle relaxants and ice for the symptoms. The treatment provided no relief.    Past Medical History:  Diagnosis Date  . Hypertension     There are no active problems to display for this patient.   History reviewed. No pertinent surgical history.     Home Medications    Prior to Admission medications   Medication Sig Start Date End Date Taking? Authorizing Provider  dicyclomine (BENTYL) 20 MG tablet Take 1 tablet (20 mg total) by mouth 2 (two) times daily. 10/12/16   Rolan BuccoBelfi, Melanie, MD  lidocaine (LIDODERM) 5 % Place 1 patch onto the skin daily. Remove & Discard patch within 12 hours or as directed by MD 06/12/17   Antony MaduraHumes, Kelly, PA-C  methocarbamol (ROBAXIN) 500 MG tablet Take 1 tablet (500 mg total) by mouth every 8 (eight) hours as needed for muscle spasms. 01/13/18   Petrucelli, Samantha R, PA-C  naproxen (NAPROSYN) 500 MG tablet Take 1 tablet (500 mg total) by mouth 2 (two) times daily. 01/13/18   Petrucelli, Lelon MastSamantha R, PA-C  ondansetron (ZOFRAN ODT) 4 MG disintegrating tablet 4mg  ODT q4 hours prn nausea/vomit 10/12/16   Rolan BuccoBelfi, Melanie, MD  predniSONE (DELTASONE)  20 MG tablet Take 2 tablets (40 mg total) by mouth daily. Take 40 mg by mouth daily for 3 days, then 20mg  by mouth daily for 3 days, then 10mg  daily for 3 days 06/12/17   Antony Madura, PA-C    Family History Family History  Problem Relation Age of Onset  . Hypertension Father     Social History Social History   Tobacco Use  . Smoking status: Current Every Day Smoker    Packs/day: 0.10    Years: 25.00    Pack years: 2.50    Types: Cigarettes  . Smokeless tobacco: Never Used  Substance Use  Topics  . Alcohol use: Yes    Comment: 6 pack/week  . Drug use: Yes    Types: Marijuana     Allergies   Codeine   Review of Systems Review of Systems  Constitutional: Negative for chills and fever.  Respiratory: Negative for shortness of breath.   Cardiovascular: Negative for chest pain.  Gastrointestinal: Negative for abdominal pain, bowel incontinence, constipation, diarrhea, nausea and vomiting.  Genitourinary: Negative for bladder incontinence, difficulty urinating (no incontinence), dysuria and hematuria.  Musculoskeletal: Positive for back pain. Negative for arthralgias and myalgias.  Skin: Negative for color change.  Allergic/Immunologic: Negative for immunocompromised state.  Neurological: Negative for tingling, weakness, numbness and paresthesias.  Psychiatric/Behavioral: Negative for confusion.   All other systems reviewed and are negative for acute change except as noted in the HPI.    Physical Exam Updated Vital Signs BP (!) 147/92 (BP Location: Right Arm)   Pulse 86   Temp 98.2 F (36.8 C) (Oral)   Resp 18   Ht 5\' 10"  (1.778 m)   Wt 97.5 kg (215 lb)   SpO2 98%   BMI 30.85 kg/m   Physical Exam  Constitutional: He is oriented to person, place, and time. Vital signs are normal. He appears well-developed and well-nourished.  Non-toxic appearance. No distress.  Afebrile, nontoxic, NAD  HENT:  Head: Normocephalic and atraumatic.  Mouth/Throat: Mucous membranes are normal.  Eyes: Conjunctivae and EOM are normal. Right eye exhibits no discharge. Left eye exhibits no discharge.  Neck: Normal range of motion. Neck supple.  Cardiovascular: Normal rate and intact distal pulses.  Pulmonary/Chest: Effort normal. No respiratory distress.  Abdominal: Normal appearance. He exhibits no distension.  Musculoskeletal: Normal range of motion.       Lumbar back: He exhibits tenderness and spasm. He exhibits normal range of motion and no bony tenderness.  Lumbar spine with  FROM intact without spinous process TTP, no bony stepoffs or deformities, with mild L sided paraspinous muscle TTP and palpable muscle spasms. Strength and sensation grossly intact in all extremities, negative SLR bilaterally, gait steady and nonantalgic. No overlying skin changes. Distal pulses intact.   Neurological: He is alert and oriented to person, place, and time. He has normal strength. No sensory deficit.  Skin: Skin is warm, dry and intact. No rash noted.  Psychiatric: He has a normal mood and affect.  Nursing note and vitals reviewed.    ED Treatments / Results  Labs (all labs ordered are listed, but only abnormal results are displayed) Labs Reviewed - No data to display  EKG  EKG Interpretation None       Radiology No results found.   Lumbar Xray 07/26/17: Study Result  CLINICAL DATA:  Low back pain for 10 months.  EXAM: LUMBAR SPINE - COMPLETE 4+ VIEW  COMPARISON:  None.  FINDINGS: There is no evidence of lumbar  spine fracture or bony lesion. Alignment is normal. Intervertebral disc spaces are maintained and there is no evidence of significant degenerative disc disease.  IMPRESSION: Normal lumbar spine radiographs.   Electronically Signed   By: Irish Lack M.D.   On: 07/26/2017 08:26     Procedures Procedures (including critical care time)  Medications Ordered in ED Medications - No data to display   Initial Impression / Assessment and Plan / ED Course  I have reviewed the triage vital signs and the nursing notes.  Pertinent labs & imaging results that were available during my care of the patient were reviewed by me and considered in my medical decision making (see chart for details).     52 y.o. male here with c/o back pain. On exam, mild L lumbar paraspinous muscle TTP; no midline spinal tenderness. No red flag s/s of low back pain. No s/s of central cord compression or cauda equina. Lower extremities are neurovascularly intact and  patient is ambulating without difficulty. No urinary complaints. Doubt need for imaging/labs, likely muscular strain. Already had xray in 07/2017 which was negative. This seems to be a recurrent issue, as he's had 5 visits in 2018 for this, and was just seen 5 days ago for same issue.   Patient was counseled on back pain precautions and told to do activity as tolerated but do not lift, push, or pull heavy objects more than 10 pounds for the next week. Patient counseled to use ice or heat on back for no longer than 15 minutes every hour. Advised proper shoe support. OTC lidocaine patches discussed.   Rx given for flexeril muscle relaxer to see if this works better than the robaxin he was given a few days ago; counseled on proper use of muscle relaxant medication. Urged patient not to drink alcohol, drive, or perform any other activities that requires focus while taking these medications. Rx for mobic given, discussed avoidance of other NSAIDs while on this med. Advised tylenol use as well.     Patient urged to follow-up with orthopedist in 1-2 weeks for ongoing management of his recurrent back pain, or sooner if pain does not improve with treatment and rest or if pain becomes recurrent. Urged to return with worsening severe pain, loss of bowel or bladder control, trouble walking. The patient verbalizes understanding and agrees with the plan.    Final Clinical Impressions(s) / ED Diagnoses   Final diagnoses:  Acute left-sided low back pain without sciatica  Back muscle spasm    ED Discharge Orders        Ordered    cyclobenzaprine (FLEXERIL) 10 MG tablet  3 times daily PRN     01/17/18 1427    meloxicam (MOBIC) 15 MG tablet  Daily     01/17/18 [address] [place], [place] 01/17/18 1434    Rolan Bucco, MD 01/17/18 1507

## 2018-01-17 NOTE — Discharge Instructions (Signed)
Back Pain: Your back pain should be treated with medicines such as ibuprofen or aleve and this back pain should get better over the next 2 weeks.  However if you develop severe or worsening pain, low back pain with fever, numbness, weakness or inability to walk or urinate, you should return to the ER immediately.  Please follow up with your doctor this week for a recheck if still having symptoms.  Avoid heavy lifting over 10 pounds over the next two weeks.  Low back pain is discomfort in the lower back that may be due to injuries to muscles and ligaments around the spine.  Occasionally, it may be caused by a a problem to a part of the spine called a disc.  The pain may last several days or a week;  However, most patients get completely well in 4 weeks.  Self - care:  The application of heat can help soothe the pain.  Maintaining your daily activities, including walking, is encourged, as it will help you get better faster than just staying in bed. Perform gentle stretching as discussed. Drink plenty of fluids.  Medications are also useful to help with pain control.  A commonly prescribed medication includes over the counter Tylenol; take as directed on the bottle.   Non steroidal anti inflammatory medications including mobic;  These medications help both pain and swelling and are very useful in treating back pain.  They should be taken with food, as they can cause stomach upset, and more seriously, stomach bleeding.  Use mobic daily as directed, don't use additional NSAIDs like ibuprofen/aleve/etc while taking mobic.     Muscle relaxants (flexeril):  These medications can help with muscle tightness that is a cause of lower back pain.  Most of these medications can cause drowsiness, and it is not safe to drive or use dangerous machinery while taking them.  You may also consider using over the counter lidocaine patches or muscle rubs. Use supportive shoes and inserts in your shoes.   SEEK IMMEDIATE  MEDICAL ATTENTION IF: New numbness, tingling, weakness, or problem with the use of your arms or legs.  Severe back pain not relieved with medications.  Difficulty with or loss of control of your bowel or bladder control.  Increasing pain in any areas of the body (such as chest or abdominal pain).  Shortness of breath, dizziness or fainting.  Nausea (feeling sick to your stomach), vomiting, fever, or sweats.  You will need to follow up with  the orthopedist in 1-2 weeks for reassessment.

## 2018-01-22 ENCOUNTER — Emergency Department (HOSPITAL_COMMUNITY)
Admission: EM | Admit: 2018-01-22 | Discharge: 2018-01-22 | Disposition: A | Discharge status: Home / Self Care | Payer: Self-pay | Attending: Emergency Medicine | Admitting: Emergency Medicine

## 2018-01-22 ENCOUNTER — Encounter (HOSPITAL_COMMUNITY): Payer: Self-pay | Admitting: *Deleted

## 2018-01-22 ENCOUNTER — Other Ambulatory Visit: Payer: Self-pay

## 2018-01-22 DIAGNOSIS — M545 Low back pain: Secondary | ICD-10-CM | POA: Insufficient documentation

## 2018-01-22 DIAGNOSIS — F17210 Nicotine dependence, cigarettes, uncomplicated: Secondary | ICD-10-CM | POA: Insufficient documentation

## 2018-01-22 DIAGNOSIS — G8929 Other chronic pain: Secondary | ICD-10-CM

## 2018-01-22 DIAGNOSIS — I10 Essential (primary) hypertension: Secondary | ICD-10-CM | POA: Insufficient documentation

## 2018-01-22 MED ORDER — DICLOFENAC SODIUM 1 % TD GEL: 2.0000 g | Freq: Four times a day (QID) | TRANSDERMAL | 0 refills | Status: DC

## 2018-01-22 MED ORDER — MENTHOL (TOPICAL ANALGESIC) 7.5 % (ROLL) EX MISC: 1.0000 | Freq: Two times a day (BID) | CUTANEOUS | 0 refills | Status: DC | PRN

## 2018-01-22 NOTE — ED Triage Notes (Signed)
To ED for eval of continued lower back pain. No radiation. Seen last week for same and given Flexeril which helped 'some what'. Ambulatory

## 2018-01-22 NOTE — ED Notes (Signed)
Pt states he would like to skip xray. Pt says he hs had them before and nothing was found. Mathews RobinsonsJessica Mitchell PA informed.

## 2018-01-22 NOTE — Discharge Instructions (Signed)
As discussed, continue with naproxen and muscle relaxer as needed.  Make sure not to drive while taking that medication. You may want to try muscle creams, massage, heat, and prescribed diclofenac gel.  Follow-up with the wellness center and make sure to establish care with a primary care provider can monitor for improvement. If your symptoms persist, they may suggest physical therapy or follow-up with a spine specialist as needed.  Return if symptoms worsen, loss of bowel or bladder function, numbness, weakness, fever, chills or other new concerning symptoms in the meantime.

## 2018-01-22 NOTE — ED Provider Notes (Signed)
[facility] Provider Note   CSN: [id number] Arrival date & time: 01/22/18  1156     History   Chief Complaint Chief Complaint  Patient presents with  . Back Pain    HPI Joseph [name] is a 52 y.o. male past medical history of hypertension presenting with chronic low back pain that has been intermittent over the last 6-7 months.  States that it is mainly left-sided but getting closer to midline.  He denies any trauma or recent injury. His pain is aggravated by standing for prolonged periods of time, he reports improvement from naproxen and Flexeril.  He denies any numbness, weakness, loss of bowel bladder function, history of IV drug use, unexplained weight loss, fever or chills.  He is otherwise in his usual state of health and reports that he has no problems lifting anything at work as he is using his legs, but he would like a note to help with the prolonged standing.   HPI  Past Medical History:  Diagnosis Date  . Hypertension     There are no active problems to display for this patient.   History reviewed. No pertinent surgical history.     Home Medications    Prior to Admission medications   Medication Sig Start Date End Date Taking? Authorizing Provider  cyclobenzaprine (FLEXERIL) 10 MG tablet Take 1 tablet (10 mg total) by mouth 3 (three) times daily as needed for muscle spasms. 01/17/18   Street, SloanMercedes, PA-C  diclofenac sodium (VOLTAREN) 1 % GEL Apply 2 g topically 4 (four) times daily. 01/22/18   Georgiana ShoreMitchell, Montoya Brandel B, PA-C  meloxicam (MOBIC) 15 MG tablet Take 1 tablet (15 mg total) by mouth daily. TAKE WITH MEALS 01/17/18   Street, [place]cedes, PA-C  Menthol, Topical Analgesic, (ICY HOT) 7.5 % (Roll) MISC Apply 1 each topically 2 (two) times daily as needed. 01/22/18   Georgiana ShoreMitchell, Ambre Kobayashi B, PA-C  methocarbamol (ROBAXIN) 500 MG tablet Take 1 tablet (500 mg total) by mouth every 8 (eight) hours as needed for muscle spasms. Patient  not taking: Reported on 01/17/2018 01/13/18   Petrucelli, Lelon MastSamantha R, PA-C  naproxen (NAPROSYN) 500 MG tablet Take 1 tablet (500 mg total) by mouth 2 (two) times daily. Patient not taking: Reported on 01/17/2018 01/13/18   Petrucelli, Lelon MastSamantha R, PA-C  naproxen sodium (ALEVE) 220 MG tablet Take 440 mg by mouth 2 (two) times daily as needed (For back pain.).    [provider]    Family History Family History  Problem Relation Age of Onset  . Hypertension Father     Social History Social History   Tobacco Use  . Smoking status: Current Every Day Smoker    Packs/day: 0.10    Years: 25.00    Pack years: 2.50    Types: Cigarettes  . Smokeless tobacco: Never Used  Substance Use Topics  . Alcohol use: Yes    Comment: 6 pack/week  . Drug use: Yes    Types: Marijuana     Allergies   Codeine   Review of Systems Review of Systems  Constitutional: Negative for chills, diaphoresis, fatigue, fever and unexpected weight change.  Respiratory: Negative for cough, shortness of breath, wheezing and stridor.   Cardiovascular: Negative for chest pain, palpitations and leg swelling.  Gastrointestinal: Negative for abdominal distention, abdominal pain, nausea and vomiting.  Genitourinary: Negative for decreased urine volume, difficulty urinating, dysuria, flank pain and hematuria.  Musculoskeletal: Positive for back pain and myalgias. Negative for arthralgias,  gait problem, joint swelling, neck pain and neck stiffness.  Skin: Negative for color change, pallor, rash and wound.  Neurological: Negative for seizures, syncope, weakness and numbness.  All other systems reviewed and are negative.    Physical Exam Updated Vital Signs BP (!) 141/92 (BP Location: Right Arm)   Pulse 77   Temp 98.6 F (37 C) (Oral)   Resp 16   SpO2 99%   Physical Exam  Constitutional: He appears well-developed and well-nourished. No distress.  Well-appearing, afebrile nontoxic sitting comfortably in bed  no acute distress.  HENT:  Head: Normocephalic and atraumatic.  Eyes: Conjunctivae are normal.  Neck: Neck supple.  Cardiovascular: Normal rate, regular rhythm and intact distal pulses.  Pulmonary/Chest: Effort normal. No respiratory distress.  Abdominal: He exhibits no distension.  Musculoskeletal: Normal range of motion. He exhibits tenderness. He exhibits no edema or deformity.  ttp of left lower back musculature near midline.  Neurological: He is alert. No sensory deficit. He exhibits normal muscle tone.  Normal stance and gait, normal balance, 5/5 strength in lower extremities bilaterally, neurovascularly intact.  Skin: Skin is warm and dry. No rash noted. He is not diaphoretic. No erythema. No pallor.  Psychiatric: He has a normal mood and affect.  Nursing note and vitals reviewed.    ED Treatments / Results  Labs (all labs ordered are listed, but only abnormal results are displayed) Labs Reviewed - No data to display  EKG  EKG Interpretation None       Radiology No results found.  Procedures Procedures (including critical care time)  Medications Ordered in ED Medications - No data to display   Initial Impression / Assessment and Plan / ED Course  I have reviewed the triage vital signs and the nursing notes.  Pertinent labs & imaging results that were available during my care of the patient were reviewed by me and considered in my medical decision making (see chart for details).    Patient presents with lower back pain.  No gross neurological deficits and normal neuro exam.  Patient has no gait abnormality or concern for cauda equina.  No loss of bowel or bladder control, fever, night sweats, weight loss, h/o malignancy, or IVDU.    RICE protocol and pain medications indicated and discussed with patient.    Afebrile, nontoxic, no history of IV drug use.  Offered to obtain lumbar films but patient declined.  Strongly encourage patient to follow-up with the  wellness center to establish care with a primary care provider.  Patient was provided with functions and back exercises.  Charge home with symptomatic relief and close follow-up with PCP.  Discussed strict return precautions and advised to return to the emergency department if experiencing any new or worsening symptoms. Instructions were understood and patient agreed with discharge plan. Final Clinical Impressions(s) / ED Diagnoses   Final diagnoses:  Chronic left-sided low back pain without sciatica    ED Discharge Orders        Ordered    diclofenac sodium (VOLTAREN) 1 % GEL  4 times daily     01/22/18 1430    Menthol, Topical Analgesic, (ICY HOT) 7.5 % (Roll) MISC  2 times daily PRN     01/22/18 1431       Gregary Cromer 01/22/18 1442    Arby Barrette, MD 01/22/18 1729

## 2018-02-15 ENCOUNTER — Emergency Department (HOSPITAL_COMMUNITY)
Admission: EM | Admit: 2018-02-15 | Discharge: 2018-02-15 | Disposition: A | Discharge status: Home / Self Care | Payer: Self-pay | Attending: Emergency Medicine | Admitting: Emergency Medicine

## 2018-02-15 DIAGNOSIS — Z79899 Other long term (current) drug therapy: Secondary | ICD-10-CM | POA: Insufficient documentation

## 2018-02-15 DIAGNOSIS — I10 Essential (primary) hypertension: Secondary | ICD-10-CM | POA: Insufficient documentation

## 2018-02-15 DIAGNOSIS — G8929 Other chronic pain: Secondary | ICD-10-CM | POA: Insufficient documentation

## 2018-02-15 DIAGNOSIS — M545 Low back pain: Secondary | ICD-10-CM | POA: Insufficient documentation

## 2018-02-15 DIAGNOSIS — F17210 Nicotine dependence, cigarettes, uncomplicated: Secondary | ICD-10-CM | POA: Insufficient documentation

## 2018-02-15 MED ORDER — METHOCARBAMOL 500 MG PO TABS: 500.0000 mg | ORAL_TABLET | Freq: Two times a day (BID) | ORAL | 0 refills | Status: DC

## 2018-02-15 MED ORDER — NAPROXEN 500 MG PO TABS: 500.0000 mg | ORAL_TABLET | Freq: Two times a day (BID) | ORAL | 0 refills | Status: DC

## 2018-02-15 NOTE — Discharge Instructions (Signed)
Please take muscle relaxer medicine for pain.  This medicine can make you drowsy so please do not drive or work while taking it.  You can also take Naprosyn which is an anti-inflammatory medicine.  Please schedule an appointment with [facility] as we discussed to establish care and for follow-up of your ongoing back pain.  Return to the emergency department if you have loss of sensation in your feet or legs, have weakness, lose bowel or bladder control or have any new or concerning symptoms.

## 2018-02-15 NOTE — ED Provider Notes (Signed)
[facility]-EMERGENCY DEPT Provider Note   CSN: [id number] Arrival date & time: 02/15/18  1211     History   Chief Complaint No chief complaint on file.   HPI Joseph [name] is a 52 y.o. male.  HPI   Joseph [name] is a 52 year old male with a history of hypertension who presents to the emergency department for evaluation of left lower back pain.  Patient states he has had intermittent pain in this area for the past 7 or 8 months now.  States that pain is about a 4/10 in severity and feels aching in nature.  Pain is worsened with bending at the hip or movement.  He reports improvement with naproxen and Robaxin, although he ran out of this medicine a few weeks ago.  He denies inciting injury or recent heavy lifting.  He denies personal history of cancer or IV drug use.  He denies fever, chills, night sweats, unexpected weight loss, numbness, weakness, loss of bowel or bladder control, saddle anesthesia, abdominal pain, dysuria, urinary frequency.  He is able to ambulate independently without trouble.  He is asking for a work note. Reports that he does not have a PCP, has been referred to [facility] in the past although it "takes weeks" to get in to see them so he has not establish care.  Past Medical History:  Diagnosis Date  . Hypertension     There are no active problems to display for this patient.   No past surgical history on file.      Home Medications    Prior to Admission medications   Medication Sig Start Date End Date Taking? Authorizing Provider  cyclobenzaprine (FLEXERIL) 10 MG tablet Take 1 tablet (10 mg total) by mouth 3 (three) times daily as needed for muscle spasms. 01/17/18   Street, [place], PA-C  diclofenac sodium (VOLTAREN) 1 % GEL Apply 2 g topically 4 (four) times daily. 01/22/18   Georgiana Shore, PA-C  meloxicam (MOBIC) 15 MG tablet Take 1 tablet (15 mg total) by mouth daily. TAKE WITH MEALS 01/17/18   Street, [place], PA-C    Menthol, Topical Analgesic, (ICY HOT) 7.5 % (Roll) MISC Apply 1 each topically 2 (two) times daily as needed. 01/22/18   Georgiana Shore, PA-C  methocarbamol (ROBAXIN) 500 MG tablet Take 1 tablet (500 mg total) by mouth every 8 (eight) hours as needed for muscle spasms. Patient not taking: Reported on 01/17/2018 01/13/18   Petrucelli, Lelon Mast R, PA-C  naproxen (NAPROSYN) 500 MG tablet Take 1 tablet (500 mg total) by mouth 2 (two) times daily. Patient not taking: Reported on 01/17/2018 01/13/18   Petrucelli, Lelon Mast R, PA-C  naproxen sodium (ALEVE) 220 MG tablet Take 440 mg by mouth 2 (two) times daily as needed (For back pain.).    [provider]    Family History Family History  Problem Relation Age of Onset  . Hypertension Father     Social History Social History   Tobacco Use  . Smoking status: Current Every Day Smoker    Packs/day: 0.10    Years: 25.00    Pack years: 2.50    Types: Cigarettes  . Smokeless tobacco: Never Used  Substance Use Topics  . Alcohol use: Yes    Comment: 6 pack/week  . Drug use: Yes    Types: Marijuana     Allergies   Codeine   Review of Systems Review of Systems  Constitutional: Negative for chills, diaphoresis, fever and unexpected weight  change.  Gastrointestinal: Negative for abdominal pain, nausea and vomiting.  Genitourinary: Negative for difficulty urinating, dysuria and frequency.  Musculoskeletal: Positive for back pain (left lower).  Skin: Negative for wound.  Neurological: Negative for weakness and numbness.     Physical Exam Updated Vital Signs BP (!) 130/93 (BP Location: Right Arm)   Pulse 85   Temp 97.9 F (36.6 C) (Oral)   Resp 18   SpO2 96%   Physical Exam  Constitutional: He appears well-developed and well-nourished. No distress.  HENT:  Head: Normocephalic and atraumatic.  Eyes: Right eye exhibits no discharge. Left eye exhibits no discharge.  Pulmonary/Chest: Effort normal. No respiratory distress.   Abdominal: Soft. Bowel sounds are normal. There is no tenderness.  Musculoskeletal:  No midline T-spine or L-spine tenderness.  Mildly tender to palpation over the left paraspinal muscles of the lumbar spine.  Strength 5/5 in bilateral knee flexion/extension and ankle dorsiflexion/plantarflexion.  DP pulses 2+ bilaterally.  Neurological: He is alert. Coordination normal.  Patellar reflex 1+ and symmetric bilaterally.  Distal sensation to light touch intact in bilateral lower extremities.  Gait normal and coordination and balance.  Skin: Skin is warm and dry. Capillary refill takes less than 2 seconds. He is not diaphoretic.  Psychiatric: He has a normal mood and affect. His behavior is normal.  Nursing note and vitals reviewed.    ED Treatments / Results  Labs (all labs ordered are listed, but only abnormal results are displayed) Labs Reviewed - No data to display  EKG None  Radiology No results found.  Procedures Procedures (including critical care time)  Medications Ordered in ED Medications - No data to display   Initial Impression / Assessment and Plan / ED Course  I have reviewed the triage vital signs and the nursing notes.  Pertinent labs & imaging results that were available during my care of the patient were reviewed by me and considered in my medical decision making (see chart for details).    Patient with back pain. Has a history of similar, seen several times for this over the past year. Is asking for refill of muscle relaxer, as this has aleviated pain for him in the past. No neurological deficits and normal neuro exam. Patient can walk without diffiuclty. No loss of bowel or bladder control. No concern for cauda equina.  No fever, night sweats, weight loss, h/o cancer, IVDU. RICE protocol and pain medicine indicated and discussed with patient. Have counseled him to establish care with [facility] for further back pain management and general health maintenance.   Discussed reasons to return to the emergency department.  Patient agrees and voiced understanding to the above plan and has no complaints prior to discharge.   Final Clinical Impressions(s) / ED Diagnoses   Final diagnoses:  Chronic left-sided low back pain without sciatica    ED Discharge Orders        Ordered    methocarbamol (ROBAXIN) 500 MG tablet  2 times daily     02/15/18 1319    naproxen (NAPROSYN) 500 MG tablet  2 times daily     02/15/18 1319       Lawrence MarseillesShrosbree, Javeon Macmurray J, PA-C 02/15/18 1321    Jacalyn LefevreHaviland, Julie, MD 02/15/18 1440

## 2018-02-15 NOTE — ED Triage Notes (Signed)
Patient here with c/o back pain that been going on for a few months patient rating his pain at 8/10. Patient ambulatory to the room.

## 2018-02-17 ENCOUNTER — Ambulatory Visit (HOSPITAL_COMMUNITY)
Admission: EM | Admit: 2018-02-17 | Discharge: 2018-02-17 | Disposition: A | Discharge status: Home / Self Care | Payer: Self-pay

## 2018-10-01 ENCOUNTER — Emergency Department (HOSPITAL_COMMUNITY)
Admission: EM | Admit: 2018-10-01 | Discharge: 2018-10-01 | Disposition: A | Discharge status: Home / Self Care | Payer: Self-pay | Attending: Emergency Medicine | Admitting: Emergency Medicine

## 2018-10-01 ENCOUNTER — Emergency Department (HOSPITAL_COMMUNITY): Payer: Self-pay

## 2018-10-01 ENCOUNTER — Encounter (HOSPITAL_COMMUNITY): Payer: Self-pay | Admitting: Emergency Medicine

## 2018-10-01 DIAGNOSIS — F17210 Nicotine dependence, cigarettes, uncomplicated: Secondary | ICD-10-CM | POA: Insufficient documentation

## 2018-10-01 DIAGNOSIS — R197 Diarrhea, unspecified: Secondary | ICD-10-CM

## 2018-10-01 DIAGNOSIS — Z79899 Other long term (current) drug therapy: Secondary | ICD-10-CM | POA: Insufficient documentation

## 2018-10-01 DIAGNOSIS — R112 Nausea with vomiting, unspecified: Secondary | ICD-10-CM | POA: Insufficient documentation

## 2018-10-01 DIAGNOSIS — R1032 Left lower quadrant pain: Secondary | ICD-10-CM | POA: Insufficient documentation

## 2018-10-01 DIAGNOSIS — I10 Essential (primary) hypertension: Secondary | ICD-10-CM | POA: Insufficient documentation

## 2018-10-01 LAB — COMPREHENSIVE METABOLIC PANEL
ALT: 21 U/L (ref 0–44)
AST: 24 U/L (ref 15–41)
Albumin: 3.7 g/dL (ref 3.5–5.0)
Alkaline Phosphatase: 57 U/L (ref 38–126)
Anion gap: 8 (ref 5–15)
BUN: 6 mg/dL (ref 6–20)
CO2: 24 mmol/L (ref 22–32)
Calcium: 8.7 mg/dL — ABNORMAL LOW (ref 8.9–10.3)
Chloride: 107 mmol/L (ref 98–111)
Creatinine, Ser: 1.17 mg/dL (ref 0.61–1.24)
GFR calc Af Amer: >60 mL/min (ref >60)
GFR calc non Af Amer: >60 mL/min (ref >60)
Glucose, Bld: 139 mg/dL — ABNORMAL HIGH (ref 70–99)
Potassium: 3.7 mmol/L (ref 3.5–5.1)
Sodium: 139 mmol/L (ref 135–145)
Total Bilirubin: 0.8 mg/dL (ref 0.3–1.2)
Total Protein: 6.3 g/dL — ABNORMAL LOW (ref 6.5–8.1)

## 2018-10-01 LAB — CBC WITH DIFFERENTIAL/PLATELET
Abs Immature Granulocytes: 0.04 10*3/uL (ref 0.00–0.07)
Basophils Absolute: 0.1 10*3/uL (ref 0.0–0.1)
Basophils Relative: 1 %
Eosinophils Absolute: 0.1 10*3/uL (ref 0.0–0.5)
Eosinophils Relative: 1 %
HCT: 42.3 % (ref 39.0–52.0)
Hemoglobin: 14.3 g/dL (ref 13.0–17.0)
Immature Granulocytes: 1 %
Lymphocytes Relative: 18 %
Lymphs Abs: 1.6 10*3/uL (ref 0.7–4.0)
MCH: 29.5 pg (ref 26.0–34.0)
MCHC: 33.8 g/dL (ref 30.0–36.0)
MCV: 87.4 fL (ref 80.0–100.0)
Monocytes Absolute: 0.9 10*3/uL (ref 0.1–1.0)
Monocytes Relative: 10 %
Neutro Abs: 5.9 10*3/uL (ref 1.7–7.7)
Neutrophils Relative %: 69 %
Platelets: 297 10*3/uL (ref 150–400)
RBC: 4.84 MIL/uL (ref 4.22–5.81)
RDW: 12.3 % (ref 11.5–15.5)
WBC: 8.5 10*3/uL (ref 4.0–10.5)
nRBC: 0 % (ref 0.0–0.2)

## 2018-10-01 LAB — URINALYSIS, ROUTINE W REFLEX MICROSCOPIC
Bilirubin Urine: NEGATIVE
Glucose, UA: NEGATIVE mg/dL
Hgb urine dipstick: NEGATIVE
Ketones, ur: NEGATIVE mg/dL
Leukocytes, UA: NEGATIVE
Nitrite: NEGATIVE
Protein, ur: NEGATIVE mg/dL
Specific Gravity, Urine: 1.023 (ref 1.005–1.030)
pH: 8 (ref 5.0–8.0)

## 2018-10-01 LAB — I-STAT TROPONIN, ED: Troponin i, poc: 0 ng/mL (ref 0.00–0.08)

## 2018-10-01 LAB — LIPASE, BLOOD: Lipase: 29 U/L (ref 11–51)

## 2018-10-01 MED ORDER — IOHEXOL 300 MG/ML  SOLN
100.0000 mL | Freq: Once | INTRAMUSCULAR | Status: AC | PRN
  Administered 2018-10-01: 100 mL via INTRAVENOUS

## 2018-10-01 MED ORDER — METOCLOPRAMIDE HCL 5 MG/ML IJ SOLN
10.0000 mg | Freq: Once | INTRAMUSCULAR | Status: AC
  Administered 2018-10-01: 10 mg via INTRAVENOUS
  Filled 2018-10-01: qty 2

## 2018-10-01 MED ORDER — DICYCLOMINE HCL 20 MG PO TABS: 20.0000 mg | ORAL_TABLET | Freq: Two times a day (BID) | ORAL | 0 refills | Status: DC

## 2018-10-01 MED ORDER — ONDANSETRON HCL 4 MG/2ML IJ SOLN
4.0000 mg | Freq: Once | INTRAMUSCULAR | Status: AC
  Administered 2018-10-01: 4 mg via INTRAVENOUS
  Filled 2018-10-01: qty 2

## 2018-10-01 MED ORDER — METOCLOPRAMIDE HCL 10 MG PO TABS: 10.0000 mg | ORAL_TABLET | Freq: Four times a day (QID) | ORAL | 0 refills | Status: DC

## 2018-10-01 MED ORDER — SODIUM CHLORIDE 0.9 % IV BOLUS
1000.0000 mL | Freq: Once | INTRAVENOUS | Status: AC
  Administered 2018-10-01: 1000 mL via INTRAVENOUS

## 2018-10-01 NOTE — ED Notes (Signed)
Patient also now endorses some soreness in his chest. Reports he has had some nasal congestion and coughing as well.

## 2018-10-01 NOTE — ED Notes (Signed)
Patient transported to CT 

## 2018-10-01 NOTE — ED Provider Notes (Signed)
[facility] EMERGENCY DEPARTMENT Provider Note   CSN: [id number] Arrival date & time: 10/01/18  1044     History   Chief Complaint Chief Complaint  Patient presents with  . Abdominal Pain  . Emesis  . Diarrhea    HPI Joseph [name] is a 52 y.o. male history of hypertension who presents with a 2-day history of nausea, vomiting, nonbloody diarrhea.  Patient has had some intermittent associated abdominal pain that he describes as cramping in his right side prior to have diarrhea.  Reports he has had at least 5 episodes of diarrhea daily.  He lost count how many episodes of emesis.  He has had emesis anytime he tries to eat or drink anything as well as out of the blue.  He has no nausea proceeding his emesis.  He denies any back pain.  He has had associated productive cough and nasal congestion.  Patient denies any fever.  Reports the diarrhea started first.  He denies any recent travel or antibiotic use.  He denies any known sick contacts.  Has not been taking medication at home for symptoms.  He also reports some chest pain and states he feels like he has been working out, however he has not.  He states he feels sore in his chest and his legs and it feels similar.  It has been constant for several days.  He denies any shortness of breath.  HPI  Past Medical History:  Diagnosis Date  . Hypertension     There are no active problems to display for this patient.   History reviewed. No pertinent surgical history.      Home Medications    Prior to Admission medications   Medication Sig Start Date End Date Taking? Authorizing Provider  cyclobenzaprine (FLEXERIL) 10 MG tablet Take 1 tablet (10 mg total) by mouth 3 (three) times daily as needed for muscle spasms. 01/17/18   Street, [place], PA-C  diclofenac sodium (VOLTAREN) 1 % GEL Apply 2 g topically 4 (four) times daily. 01/22/18   Georgiana Shore, PA-C  dicyclomine (BENTYL) 20 MG tablet Take 1 tablet (20  mg total) by mouth 2 (two) times daily. 10/01/18   Herndon Grill, Waylan Boga, PA-C  meloxicam (MOBIC) 15 MG tablet Take 1 tablet (15 mg total) by mouth daily. TAKE WITH MEALS 01/17/18   Street, [place], PA-C  Menthol, Topical Analgesic, (ICY HOT) 7.5 % (Roll) MISC Apply 1 each topically 2 (two) times daily as needed. 01/22/18   Georgiana Shore, PA-C  methocarbamol (ROBAXIN) 500 MG tablet Take 1 tablet (500 mg total) by mouth every 8 (eight) hours as needed for muscle spasms. Patient not taking: Reported on 01/17/2018 01/13/18   Petrucelli, Pleas Koch, PA-C  methocarbamol (ROBAXIN) 500 MG tablet Take 1 tablet (500 mg total) by mouth 2 (two) times daily. 02/15/18   Kellie Shropshire, PA-C  metoCLOPramide (REGLAN) 10 MG tablet Take 1 tablet (10 mg total) by mouth every 6 (six) hours. 10/01/18   Aarin Bluett, Waylan Boga, PA-C  naproxen (NAPROSYN) 500 MG tablet Take 1 tablet (500 mg total) by mouth 2 (two) times daily. Patient not taking: Reported on 01/17/2018 01/13/18   Petrucelli, Lelon Mast R, PA-C  naproxen (NAPROSYN) 500 MG tablet Take 1 tablet (500 mg total) by mouth 2 (two) times daily. 02/15/18   Kellie Shropshire, PA-C  naproxen sodium (ALEVE) 220 MG tablet Take 440 mg by mouth 2 (two) times daily as needed (For back pain.).    [provider]    Family History Family History  Problem Relation Age of Onset  . Hypertension Father     Social History Social History   Tobacco Use  . Smoking status: Current Every Day Smoker    Packs/day: 0.10    Years: 25.00    Pack years: 2.50    Types: Cigarettes  . Smokeless tobacco: Never Used  Substance Use Topics  . Alcohol use: Yes    Comment: 6 pack/week  . Drug use: Yes    Types: Marijuana     Allergies   Codeine   Review of Systems Review of Systems  Constitutional: Negative for chills and fever.  HENT: Positive for congestion. Negative for facial swelling and sore throat.   Respiratory: Positive for cough. Negative for shortness of breath.     Cardiovascular: Positive for chest pain.  Gastrointestinal: Positive for abdominal pain, blood in stool, nausea and vomiting.  Genitourinary: Negative for dysuria.  Musculoskeletal: Negative for back pain.  Skin: Negative for rash and wound.  Neurological: Negative for headaches.  Psychiatric/Behavioral: The patient is not nervous/anxious.      Physical Exam Updated Vital Signs BP 138/78 (BP Location: Right Arm)   Pulse 99   Temp 97.8 F (36.6 C) (Oral)   Resp 16   SpO2 100%   Physical Exam  Constitutional: He appears well-developed and well-nourished. No distress.  HENT:  Head: Normocephalic and atraumatic.  Mouth/Throat: Oropharynx is clear and moist. No oropharyngeal exudate.  Mildly dry mucous membranes  Eyes: Pupils are equal, round, and reactive to light. Conjunctivae are normal. Right eye exhibits no discharge. Left eye exhibits no discharge. No scleral icterus.  Neck: Normal range of motion. Neck supple. No thyromegaly present.  Cardiovascular: Normal rate, regular rhythm, normal heart sounds and intact distal pulses. Exam reveals no gallop and no friction rub.  No murmur heard. Pulmonary/Chest: Effort normal and breath sounds normal. No stridor. No respiratory distress. He has no wheezes. He has no rales. He exhibits tenderness.    Abdominal: Soft. Bowel sounds are normal. He exhibits no distension. There is tenderness in the left lower quadrant. There is no rigidity, no rebound and no guarding.  Musculoskeletal: He exhibits no edema.  Lymphadenopathy:    He has no cervical adenopathy.  Neurological: He is alert. Coordination normal.  Skin: Skin is warm and dry. No rash noted. He is not diaphoretic. No pallor.  Psychiatric: He has a normal mood and affect.  Nursing note and vitals reviewed.    ED Treatments / Results  Labs (all labs ordered are listed, but only abnormal results are displayed) Labs Reviewed  COMPREHENSIVE METABOLIC PANEL - Abnormal; Notable  for the following components:      Result Value   Glucose, Bld 139 (*)    Calcium 8.7 (*)    Total Protein 6.3 (*)    All other components within normal limits  LIPASE, BLOOD  CBC WITH DIFFERENTIAL/PLATELET  URINALYSIS, ROUTINE W REFLEX MICROSCOPIC  I-STAT TROPONIN, ED    EKG None  Radiology Dg Chest 1 View  Result Date: 10/01/2018 CLINICAL DATA:  Chest pain and productive cough for the past 2 days. EXAM: CHEST  1 VIEW COMPARISON:  10/12/2016 and PA view obtained today. FINDINGS: A single lateral view of the chest demonstrates stable mild hyperexpansion of the lungs and mildly prominent interstitial markings. The lungs are clear. Thoracic spine degenerative changes. IMPRESSION: Stable mild changes of COPD. No acute findings. Electronically Signed   By: Beckie Salts  M.D.   On: 10/01/2018 11:29   Ct Abdomen Pelvis W Contrast  Result Date: 10/01/2018 CLINICAL DATA:  52 year old with left lower quadrant pain and tenderness. Diarrhea. EXAM: CT ABDOMEN AND PELVIS WITH CONTRAST TECHNIQUE: Multidetector CT imaging of the abdomen and pelvis was performed using the standard protocol following bolus administration of intravenous contrast. CONTRAST:  100mL OMNIPAQUE IOHEXOL 300 MG/ML  SOLN COMPARISON:  06/19/2010 FINDINGS: Lower chest: Lung bases are clear.  No pleural effusions. Hepatobiliary: Normal appearance of the liver, gallbladder and portal venous system. No biliary dilatation. Pancreas: Unremarkable. No pancreatic ductal dilatation or surrounding inflammatory changes. Spleen: Normal in size without focal abnormality. Adrenals/Urinary Tract: Adrenal glands are unremarkable. Kidneys are normal, without renal calculi, focal lesion, or hydronephrosis. Bladder is unremarkable. Stomach/Bowel: Stomach is within normal limits. Appendix appears normal. No evidence of bowel wall thickening, distention, or inflammatory changes. Vascular/Lymphatic: No significant vascular findings are present. No enlarged  abdominal or pelvic lymph nodes. Reproductive: Prostate is unremarkable. Other: No free fluid.  Negative for free air. Musculoskeletal: No acute abnormality. Chronic bilateral pars defects at L5. No spondylolisthesis at L5-S1. Stable small sclerotic focus in the right iliac wing is suggestive for a bone island. IMPRESSION: No acute abnormality in the abdomen or pelvis. Electronically Signed   By: Richarda OverlieAdam  Henn M.D.   On: 10/01/2018 13:30   Dg Abdomen Acute W/chest  Result Date: 10/01/2018 CLINICAL DATA:  Left lower quadrant abdominal pain and productive cough the past 2 days. Long-time smoker. EXAM: DG ABDOMEN ACUTE W/ 1V CHEST COMPARISON:  10/12/2016. FINDINGS: Normal sized heart. Clear lungs. The lungs are hyperexpanded with mildly prominent interstitial markings. Normal bowel gas pattern without free peritoneal air. Thoracic and lumbar spine degenerative changes. IMPRESSION: No acute abnormality.  Mild changes of COPD Electronically Signed   By: Beckie SaltsSteven  [name] M.D.   On: 10/01/2018 11:28    Procedures Procedures (including critical care time)  Medications Ordered in ED Medications  ondansetron (ZOFRAN) injection 4 mg (4 mg Intravenous Given 10/01/18 1130)  sodium chloride 0.9 % bolus 1,000 mL (1,000 mLs Intravenous New Bag/Given 10/01/18 1130)  metoCLOPramide (REGLAN) injection 10 mg (10 mg Intravenous Given 10/01/18 1157)  iohexol (OMNIPAQUE) 300 MG/ML solution 100 mL (100 mLs Intravenous Contrast Given 10/01/18 1253)     Initial Impression / Assessment and Plan / ED Course  I have reviewed the triage vital signs and the nursing notes.  Pertinent labs & imaging results that were available during my care of the patient were reviewed by me and considered in my medical decision making (see chart for details).     Patient with probable viral syndrome.  Patient with nasal congestion, cough, nausea, vomiting, diarrhea.  Labs are unremarkable.  CT abdomen pelvis is negative.  There is no  diverticulitis seen.  Patient feeling better after IV fluids, Reglan.  Patient's EKG shows NSR.  Chest x-ray is clear for any acute findings, but borderline COPD.  I discussed smoking cessation with the patient.  Very low suspicion for ACS and troponin is negative.  His chest pain/"soreness" is reproducible on palpation.  Patient was discharged home with Reglan, Bentyl.  Oral fluids tolerated prior to discharge.  Supportive treatment discussed.  Strict return precautions given.  Patient understands and agrees with plan.  Patient vitals stable throughout ED course and discharged in satisfactory condition.  Final Clinical Impressions(s) / ED Diagnoses   Final diagnoses:  Nausea vomiting and diarrhea  Left lower quadrant abdominal pain    ED Discharge Orders  Ordered    metoCLOPramide (REGLAN) 10 MG tablet  Every 6 hours     10/01/18 1402    dicyclomine (BENTYL) 20 MG tablet  2 times daily     10/01/18 1402           Emi Holes, PA-C 10/01/18 1407    Tilden Fossa, MD 10/01/18 1537

## 2018-10-01 NOTE — ED Notes (Signed)
ED Provider at bedside. 

## 2018-10-01 NOTE — ED Notes (Signed)
Pt verbalized understanding of dc papers and RXs, vss, pt a/ox4, resp e/u, ambulatory upon discharge.

## 2018-10-01 NOTE — ED Triage Notes (Signed)
Patient reports abd pain, diarrhea and emesis since Thursday. States he ate some soup but was not able to keep it down.

## 2018-10-01 NOTE — Discharge Instructions (Signed)
Take Reglan every 6 hours as needed for nausea or vomiting.  Take Bentyl twice daily as needed for abdominal cramping.  He can alternate with Tylenol and ibuprofen as prescribed over-the-counter, as needed for your body aches.  Please return to emergency department immediately if you develop any new or worsening symptoms, including intractable vomiting, large bloody stools, worsening chest pain or shortness of breath, localizing, severe abdominal pain, persistent fever over 100.4, or any other concerning symptoms.

## 2018-11-18 ENCOUNTER — Emergency Department (HOSPITAL_COMMUNITY)
Admission: EM | Admit: 2018-11-18 | Discharge: 2018-11-18 | Disposition: A | Discharge status: Home / Self Care | Payer: Self-pay | Attending: Emergency Medicine | Admitting: Emergency Medicine

## 2018-11-18 ENCOUNTER — Encounter (HOSPITAL_COMMUNITY): Payer: Self-pay

## 2018-11-18 ENCOUNTER — Emergency Department (HOSPITAL_COMMUNITY): Payer: Self-pay

## 2018-11-18 DIAGNOSIS — R0781 Pleurodynia: Secondary | ICD-10-CM

## 2018-11-18 DIAGNOSIS — Z043 Encounter for examination and observation following other accident: Secondary | ICD-10-CM | POA: Insufficient documentation

## 2018-11-18 DIAGNOSIS — I10 Essential (primary) hypertension: Secondary | ICD-10-CM | POA: Insufficient documentation

## 2018-11-18 DIAGNOSIS — F17210 Nicotine dependence, cigarettes, uncomplicated: Secondary | ICD-10-CM | POA: Insufficient documentation

## 2018-11-18 DIAGNOSIS — R0789 Other chest pain: Secondary | ICD-10-CM | POA: Insufficient documentation

## 2018-11-18 DIAGNOSIS — M542 Cervicalgia: Secondary | ICD-10-CM | POA: Insufficient documentation

## 2018-11-18 DIAGNOSIS — Z79899 Other long term (current) drug therapy: Secondary | ICD-10-CM | POA: Insufficient documentation

## 2018-11-18 MED ORDER — METHOCARBAMOL 500 MG PO TABS
750.0000 mg | ORAL_TABLET | Freq: Once | ORAL | Status: AC
  Administered 2018-11-18: 750 mg via ORAL
  Filled 2018-11-18: qty 2

## 2018-11-18 MED ORDER — METHOCARBAMOL 500 MG PO TABS: 500.0000 mg | ORAL_TABLET | Freq: Two times a day (BID) | ORAL | 0 refills | Status: DC

## 2018-11-18 MED ORDER — ACETAMINOPHEN 325 MG PO TABS
650.0000 mg | ORAL_TABLET | Freq: Once | ORAL | Status: AC
  Administered 2018-11-18: 650 mg via ORAL
  Filled 2018-11-18: qty 2
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
## 2019-04-01 ENCOUNTER — Emergency Department (HOSPITAL_COMMUNITY)
Admission: EM | Admit: 2019-04-01 | Discharge: 2019-04-01 | Disposition: A | Discharge status: Home / Self Care | Payer: Self-pay | Attending: Emergency Medicine | Admitting: Emergency Medicine

## 2019-04-01 ENCOUNTER — Other Ambulatory Visit: Payer: Self-pay

## 2019-04-01 ENCOUNTER — Emergency Department (HOSPITAL_COMMUNITY): Payer: Self-pay

## 2019-04-01 ENCOUNTER — Encounter (HOSPITAL_COMMUNITY): Payer: Self-pay | Admitting: Oncology

## 2019-04-01 DIAGNOSIS — S61412A Laceration without foreign body of left hand, initial encounter: Secondary | ICD-10-CM | POA: Insufficient documentation

## 2019-04-01 DIAGNOSIS — W25XXXA Contact with sharp glass, initial encounter: Secondary | ICD-10-CM | POA: Insufficient documentation

## 2019-04-01 DIAGNOSIS — Y998 Other external cause status: Secondary | ICD-10-CM | POA: Insufficient documentation

## 2019-04-01 DIAGNOSIS — Y9389 Activity, other specified: Secondary | ICD-10-CM | POA: Insufficient documentation

## 2019-04-01 DIAGNOSIS — Y9289 Other specified places as the place of occurrence of the external cause: Secondary | ICD-10-CM | POA: Insufficient documentation

## 2019-04-01 DIAGNOSIS — F1210 Cannabis abuse, uncomplicated: Secondary | ICD-10-CM | POA: Insufficient documentation

## 2019-04-01 DIAGNOSIS — F17210 Nicotine dependence, cigarettes, uncomplicated: Secondary | ICD-10-CM | POA: Insufficient documentation

## 2019-04-01 DIAGNOSIS — I10 Essential (primary) hypertension: Secondary | ICD-10-CM | POA: Insufficient documentation

## 2019-04-01 MED ORDER — LIDOCAINE-EPINEPHRINE 1 %-1:100000 IJ SOLN
10.0000 mL | Freq: Once | INTRAMUSCULAR | Status: DC
  Filled 2019-04-01: qty 10

## 2019-04-01 MED ORDER — LIDOCAINE-EPINEPHRINE (PF) 2 %-1:200000 IJ SOLN: 10.0000 mL | Freq: Once | INTRAMUSCULAR | Status: DC

## 2019-04-01 MED ORDER — LIDOCAINE HCL (PF) 1 % IJ SOLN
INTRAMUSCULAR | Status: AC
  Administered 2019-04-01: 05:00:00
  Filled 2019-04-01: qty 30

## 2019-04-01 NOTE — ED Provider Notes (Signed)
[facility] Provider Note   CSN: [id number] Arrival date & time: 04/01/19  0350    History   Chief Complaint No chief complaint on file.   HPI Joseph [name] is a 53 y.o. male.    The history is provided by the patient. No language interpreter was used.  Laceration  Location:  Hand Hand laceration location:  L hand and L palm Length:  4 Depth:  Through dermis Quality: straight   Bleeding: controlled   Time since incident:  2 hours Laceration mechanism:  Broken glass Pain details:    Quality:  Aching   Severity:  Mild   Timing:  Constant   Progression:  Unchanged Relieved by:  None tried Worsened by:  Pressure Tetanus status:  Up to date (last Tdap in 01/2017) Associated symptoms: no focal weakness, no numbness and no swelling     Past Medical History:  Diagnosis Date  . Hypertension     There are no active problems to display for this patient.   History reviewed. No pertinent surgical history.      Home Medications    Prior to Admission medications   Medication Sig Start Date End Date Taking? Authorizing Provider  cyclobenzaprine (FLEXERIL) 10 MG tablet Take 1 tablet (10 mg total) by mouth 3 (three) times daily as needed for muscle spasms. 01/17/18   Street, WayneMercedes, PA-C  diclofenac sodium (VOLTAREN) 1 % GEL Apply 2 g topically 4 (four) times daily. 01/22/18   Georgiana ShoreMitchell, Jessica B, PA-C  dicyclomine (BENTYL) 20 MG tablet Take 1 tablet (20 mg total) by mouth 2 (two) times daily. 10/01/18   Law, Waylan BogaAlexandra M, PA-C  meloxicam (MOBIC) 15 MG tablet Take 1 tablet (15 mg total) by mouth daily. TAKE WITH MEALS 01/17/18   Street, Birch RiverMercedes, PA-C  Menthol, Topical Analgesic, (ICY HOT) 7.5 % (Roll) MISC Apply 1 each topically 2 (two) times daily as needed. 01/22/18   Georgiana ShoreMitchell, Jessica B, PA-C  methocarbamol (ROBAXIN) 500 MG tablet Take 1 tablet (500 mg total) by mouth 2 (two) times daily. 11/18/18   Kendrick, Caitlyn S, PA-C   metoCLOPramide (REGLAN) 10 MG tablet Take 1 tablet (10 mg total) by mouth every 6 (six) hours. 10/01/18   Law, Waylan BogaAlexandra M, PA-C  naproxen (NAPROSYN) 500 MG tablet Take 1 tablet (500 mg total) by mouth 2 (two) times daily. Patient not taking: Reported on 01/17/2018 01/13/18   Petrucelli, Lelon MastSamantha R, PA-C  naproxen (NAPROSYN) 500 MG tablet Take 1 tablet (500 mg total) by mouth 2 (two) times daily. 02/15/18   Kellie ShropshireShrosbree, Emily J, PA-C  naproxen sodium (ALEVE) 220 MG tablet Take 440 mg by mouth 2 (two) times daily as needed (For back pain.).    [provider]    Family History Family History  Problem Relation Age of Onset  . Hypertension Father     Social History Social History   Tobacco Use  . Smoking status: Current Every Day Smoker    Packs/day: 0.10    Years: 25.00    Pack years: 2.50    Types: Cigarettes  . Smokeless tobacco: Never Used  Substance Use Topics  . Alcohol use: Yes    Comment: 6 pack/week  . Drug use: Yes    Types: Marijuana     Allergies   Codeine   Review of Systems Review of Systems  Skin: Positive for wound.  Neurological: Negative for focal weakness.  Ten systems reviewed and are negative for acute change, except as  noted in the HPI.    Physical Exam Updated Vital Signs BP (!) 137/93 (BP Location: Right Arm)   Pulse 99   Temp 98.1 F (36.7 C) (Oral)   Resp 20   Ht 5\' 10"  (1.778 m)   Wt 98 kg   SpO2 96%   BMI 30.99 kg/m   Physical Exam Vitals signs and nursing note reviewed.  Constitutional:      General: He is not in acute distress.    Appearance: He is well-developed. He is not diaphoretic.     Comments: Nontoxic appearing and in NAD  HENT:     Head: Normocephalic and atraumatic.  Eyes:     General: No scleral icterus.    Conjunctiva/sclera: Conjunctivae normal.  Neck:     Musculoskeletal: Normal range of motion.  Pulmonary:     Effort: Pulmonary effort is normal. No respiratory distress.  Musculoskeletal: Normal range of  motion.     Comments: Laceration through dermis to the L thenar eminence. Bleeding controlled. Superficial lacerations/avulsions to the dorsum of L 4th and 5th digits.  Skin:    General: Skin is warm and dry.     Coloration: Skin is not pale.     Findings: No erythema or rash.  Neurological:     Mental Status: He is alert and oriented to person, place, and time.     Comments: Sensation to light touch intact. Patient able to wiggle all fingers.  Psychiatric:        Behavior: Behavior normal.      ED Treatments / Results  Labs (all labs ordered are listed, but only abnormal results are displayed) Labs Reviewed - No data to display  EKG None  Radiology Dg Hand Complete Left  Result Date: 04/01/2019 CLINICAL DATA:  Left hand laceration EXAM: LEFT HAND - COMPLETE 3+ VIEW COMPARISON:  None. FINDINGS: There is no evidence of fracture or dislocation. There is no evidence of arthropathy or other focal bone abnormality. Punctate density near the laceration site along the dorsal medial aspect of the little finger, adjacent to the proximal phalanx may be a small foreign body. IMPRESSION: Possible punctate foreign body at the medial aspect of the dorsal proximal phalanx of the little finger. Electronically Signed   By: Deatra Robinson M.D.   On: 04/01/2019 04:37    Procedures Procedures (including critical care time)  LACERATION REPAIR Performed by: Antony Madura Authorized by: Antony Madura Consent: Verbal consent obtained. Risks and benefits: risks, benefits and alternatives were discussed Consent given by: patient Patient identity confirmed: provided demographic data Prepped and Draped in normal sterile fashion Wound explored  Laceration Location: thenar eminence, left  Laceration Length: 4cm  No Foreign Bodies seen or palpated  Anesthesia: local infiltration  Local anesthetic: lidocaine 1% with epinephrine  Anesthetic total: 5 ml  Irrigation method: syringe Amount of cleaning:  standard  Skin closure: 4-0 vicryl  Number of sutures: 4  Technique: simple interrupted  Patient tolerance: Patient tolerated the procedure well with no immediate complications.   Medications Ordered in ED Medications  lidocaine-EPINEPHrine (XYLOCAINE W/EPI) 1 %-1:100000 (with pres) injection 10 mL (10 mLs Other Not Given 04/01/19 0522)  lidocaine (PF) (XYLOCAINE) 1 % injection (  Given by Other 04/01/19 0522)     Initial Impression / Assessment and Plan / ED Course  I have reviewed the triage vital signs and the nursing notes.  Pertinent labs & imaging results that were available during my care of the patient were reviewed by me and  considered in my medical decision making (see chart for details).        Tdap booster UTD. Pressure irrigation performed. Laceration occurred < 8 hours prior to repair which was well tolerated. Pt has no comorbidities to effect normal wound healing. Discussed suture home care with pt and answered questions. Pt to follow up for wound check with PCP PRN. Pt is hemodynamically stable with no complaints prior to discharge.     Final Clinical Impressions(s) / ED Diagnoses   Final diagnoses:  Laceration of left hand, foreign body presence unspecified, initial encounter    ED Discharge Orders    None       Antony Madura, PA-C 04/01/19 0543    Nira Conn, MD 04/01/19 [phone]

## 2019-04-01 NOTE — ED Triage Notes (Signed)
Pt presents with laceration to left hand. States he pushed his hand through a window while trying to install an air conditioning unit. Last tetanus shot "maybe 2 years ago".

## 2019-04-01 NOTE — Discharge Instructions (Signed)
We recommend Tylenol or ibuprofen for pain.  Your stitches are dissolvable and do not need to be removed.  Keep the wound covered over the next 2 to 3 days.  Keep the area clean with soap and warm water.  Do not apply peroxide as this can breakdown newly forming skin.  Follow-up with your primary care doctor for wound recheck, if needed.

## 2019-06-02 ENCOUNTER — Other Ambulatory Visit: Payer: Self-pay

## 2019-06-02 ENCOUNTER — Emergency Department (HOSPITAL_COMMUNITY)
Admission: EM | Admit: 2019-06-02 | Discharge: 2019-06-03 | Discharge status: Against Medical Advice | Payer: Self-pay | Attending: Emergency Medicine | Admitting: Emergency Medicine

## 2019-06-02 DIAGNOSIS — Z5321 Procedure and treatment not carried out due to patient leaving prior to being seen by health care provider: Secondary | ICD-10-CM | POA: Insufficient documentation

## 2019-06-02 LAB — URINALYSIS, ROUTINE W REFLEX MICROSCOPIC
Bilirubin Urine: NEGATIVE
Glucose, UA: NEGATIVE mg/dL
Hgb urine dipstick: NEGATIVE
Ketones, ur: NEGATIVE mg/dL
Leukocytes,Ua: NEGATIVE
Nitrite: NEGATIVE
Protein, ur: NEGATIVE mg/dL
Specific Gravity, Urine: 1.005 (ref 1.005–1.030)
pH: 5 (ref 5.0–8.0)

## 2019-06-02 LAB — CBC
HCT: 42 % (ref 39.0–52.0)
Hemoglobin: 14.5 g/dL (ref 13.0–17.0)
MCH: 30.1 pg (ref 26.0–34.0)
MCHC: 34.5 g/dL (ref 30.0–36.0)
MCV: 87.3 fL (ref 80.0–100.0)
Platelets: 268 10*3/uL (ref 150–400)
RBC: 4.81 MIL/uL (ref 4.22–5.81)
RDW: 12.1 % (ref 11.5–15.5)
WBC: 15.5 10*3/uL — ABNORMAL HIGH (ref 4.0–10.5)
nRBC: 0 % (ref 0.0–0.2)

## 2019-06-02 MED ORDER — SODIUM CHLORIDE 0.9% FLUSH: 3.0000 mL | Freq: Once | INTRAVENOUS | Status: DC

## 2019-06-02 NOTE — ED Triage Notes (Signed)
Per pt about 3 hr ago pt started having abdominal pain with emesis, headache with body aches. Pt has no fevers, with a slight chill

## 2019-06-03 ENCOUNTER — Emergency Department (HOSPITAL_COMMUNITY)
Admission: EM | Admit: 2019-06-03 | Discharge: 2019-06-03 | Disposition: A | Discharge status: Home / Self Care | Payer: Self-pay | Attending: Emergency Medicine | Admitting: Emergency Medicine

## 2019-06-03 DIAGNOSIS — R519 Headache, unspecified: Secondary | ICD-10-CM

## 2019-06-03 DIAGNOSIS — R51 Headache: Secondary | ICD-10-CM | POA: Insufficient documentation

## 2019-06-03 DIAGNOSIS — Z79899 Other long term (current) drug therapy: Secondary | ICD-10-CM | POA: Insufficient documentation

## 2019-06-03 DIAGNOSIS — F17210 Nicotine dependence, cigarettes, uncomplicated: Secondary | ICD-10-CM | POA: Insufficient documentation

## 2019-06-03 DIAGNOSIS — I10 Essential (primary) hypertension: Secondary | ICD-10-CM | POA: Insufficient documentation

## 2019-06-03 LAB — COMPREHENSIVE METABOLIC PANEL
ALT: 28 U/L (ref 0–44)
ALT: 30 U/L (ref 0–44)
AST: 28 U/L (ref 15–41)
AST: 26 U/L (ref 15–41)
Albumin: 3.9 g/dL (ref 3.5–5.0)
Albumin: 3.7 g/dL (ref 3.5–5.0)
Alkaline Phosphatase: 62 U/L (ref 38–126)
Alkaline Phosphatase: 62 U/L (ref 38–126)
Anion gap: 9 (ref 5–15)
Anion gap: 7 (ref 5–15)
BUN: 9 mg/dL (ref 6–20)
BUN: 11 mg/dL (ref 6–20)
CO2: 22 mmol/L (ref 22–32)
CO2: 25 mmol/L (ref 22–32)
Calcium: 9.1 mg/dL (ref 8.9–10.3)
Calcium: 9.2 mg/dL (ref 8.9–10.3)
Chloride: 103 mmol/L (ref 98–111)
Chloride: 106 mmol/L (ref 98–111)
Creatinine, Ser: 1.1 mg/dL (ref 0.61–1.24)
Creatinine, Ser: 1.09 mg/dL (ref 0.61–1.24)
GFR calc Af Amer: >60 mL/min (ref >60)
GFR calc Af Amer: >60 mL/min (ref >60)
GFR calc non Af Amer: >60 mL/min (ref >60)
GFR calc non Af Amer: >60 mL/min (ref >60)
Glucose, Bld: 84 mg/dL (ref 70–99)
Glucose, Bld: 101 mg/dL — ABNORMAL HIGH (ref 70–99)
Potassium: 3.6 mmol/L (ref 3.5–5.1)
Potassium: 4 mmol/L (ref 3.5–5.1)
Sodium: 134 mmol/L — ABNORMAL LOW (ref 135–145)
Sodium: 138 mmol/L (ref 135–145)
Total Bilirubin: 0.7 mg/dL (ref 0.3–1.2)
Total Bilirubin: 0.8 mg/dL (ref 0.3–1.2)
Total Protein: 6.9 g/dL (ref 6.5–8.1)
Total Protein: 6.5 g/dL (ref 6.5–8.1)

## 2019-06-03 LAB — CBC WITH DIFFERENTIAL/PLATELET
Abs Immature Granulocytes: 0.04 10*3/uL (ref 0.00–0.07)
Basophils Absolute: 0 10*3/uL (ref 0.0–0.1)
Basophils Relative: 1 %
Eosinophils Absolute: 0 10*3/uL (ref 0.0–0.5)
Eosinophils Relative: 1 %
HCT: 43.5 % (ref 39.0–52.0)
Hemoglobin: 14.7 g/dL (ref 13.0–17.0)
Immature Granulocytes: 1 %
Lymphocytes Relative: 17 %
Lymphs Abs: 1.4 10*3/uL (ref 0.7–4.0)
MCH: 30.2 pg (ref 26.0–34.0)
MCHC: 33.8 g/dL (ref 30.0–36.0)
MCV: 89.3 fL (ref 80.0–100.0)
Monocytes Absolute: 0.7 10*3/uL (ref 0.1–1.0)
Monocytes Relative: 9 %
Neutro Abs: 5.9 10*3/uL (ref 1.7–7.7)
Neutrophils Relative %: 71 %
Platelets: 274 10*3/uL (ref 150–400)
RBC: 4.87 MIL/uL (ref 4.22–5.81)
RDW: 12.4 % (ref 11.5–15.5)
WBC: 8.2 10*3/uL (ref 4.0–10.5)
nRBC: 0 % (ref 0.0–0.2)

## 2019-06-03 LAB — LIPASE, BLOOD
Lipase: 23 U/L (ref 11–51)
Lipase: 27 U/L (ref 11–51)

## 2019-06-03 LAB — URINALYSIS, ROUTINE W REFLEX MICROSCOPIC
Bilirubin Urine: NEGATIVE
Glucose, UA: NEGATIVE mg/dL
Hgb urine dipstick: NEGATIVE
Ketones, ur: NEGATIVE mg/dL
Leukocytes,Ua: NEGATIVE
Nitrite: NEGATIVE
Protein, ur: NEGATIVE mg/dL
Specific Gravity, Urine: 1.01 (ref 1.005–1.030)
pH: 8 (ref 5.0–8.0)

## 2019-06-03 MED ORDER — SODIUM CHLORIDE 0.9 % IV BOLUS
1000.0000 mL | Freq: Once | INTRAVENOUS | Status: AC
  Administered 2019-06-03: 1000 mL via INTRAVENOUS

## 2019-06-03 MED ORDER — DEXAMETHASONE SODIUM PHOSPHATE 4 MG/ML IJ SOLN
4.0000 mg | Freq: Once | INTRAMUSCULAR | Status: AC
  Administered 2019-06-03: 4 mg via INTRAVENOUS
  Filled 2019-06-03: qty 1

## 2019-06-03 MED ORDER — METOCLOPRAMIDE HCL 5 MG/ML IJ SOLN
10.0000 mg | Freq: Once | INTRAMUSCULAR | Status: AC
  Administered 2019-06-03: 10 mg via INTRAVENOUS
  Filled 2019-06-03: qty 2

## 2019-06-03 MED ORDER — DIPHENHYDRAMINE HCL 50 MG/ML IJ SOLN
25.0000 mg | Freq: Once | INTRAMUSCULAR | Status: AC
  Administered 2019-06-03: 25 mg via INTRAVENOUS
  Filled 2019-06-03: qty 1

## 2019-06-03 NOTE — Discharge Instructions (Addendum)
You have been seen today for headache, body aches. Please read and follow all provided instructions. Return to the emergency room for worsening condition or new concerning symptoms.    1. Medications:  You can take tylenol for pain as needed, please take as directed on box.  2. Treatment: rest, drink plenty of fluids 3. Follow Up: Please follow up with your primary doctor in 2-5 days for discussion of your diagnoses and further evaluation after today's visit; Call today to arrange your follow up.  If you do not have a primary care doctor use the resource guide provided to find one;  -I have also provided you with information for [facility].  This is the clinic we talked about I recommend you follow-up with.  Please call them on Monday to schedule an appointment  It is also a possibility that you have an allergic reaction to any of the medicines that you have been prescribed - Everybody reacts differently to medications and while MOST people have no trouble with most medicines, you may have a reaction such as nausea, vomiting, rash, swelling, shortness of breath. If this is the case, please stop taking the medicine immediately and contact your physician.  ?

## 2019-06-03 NOTE — ED Provider Notes (Signed)
[facility] EMERGENCY DEPARTMENT Provider Note   CSN: [id number] Arrival date & time: 06/03/19  1150    History   Chief Complaint Chief Complaint  Patient presents with  . Headache  . Generalized Body Aches  . Abdominal Pain    HPI Joseph [name] is a 53 y.o. male with past medical history of hypertension presents to emergency department today with chief complaint of headache and generalized body aches x 2 days.  Patient also reported abdominal pain in triage however states this has since resolved.  He states the abdominal pain was generalized and had associated nausea without emesis or diarrhea.  He states the headache is located throughout his scalp and he describes as a throbbing sensation.  He rates it 6 of 10 in severity.  He denies sudden onset and headache has progressively worsened.  He did not take anything for pain prior to arrival. He describes generalized body aches as feeling like he worked out although he has not with aching feeling in both arms. Denies fever, syncope, head trauma, photophobia, phonophobia, UL throbbing pain, N/V, visual changes, stiff neck, neck pain, rash, seizure, jaw claudication, or "thunderclap" onset. Not first HA. Not worst HA of life. Similar to previous headaches in the past      Past Medical History:  Diagnosis Date  . Hypertension     There are no active problems to display for this patient.   No past surgical history on file.      Home Medications    Prior to Admission medications   Medication Sig Start Date End Date Taking? Authorizing Provider  cyclobenzaprine (FLEXERIL) 10 MG tablet Take 1 tablet (10 mg total) by mouth 3 (three) times daily as needed for muscle spasms. 01/17/18   Street, Tolleson, PA-C  diclofenac sodium (VOLTAREN) 1 % GEL Apply 2 g topically 4 (four) times daily. 01/22/18   Emeline General, PA-C  dicyclomine (BENTYL) 20 MG tablet Take 1 tablet (20 mg total) by mouth 2 (two) times  daily. 10/01/18   Law, Bea Graff, PA-C  meloxicam (MOBIC) 15 MG tablet Take 1 tablet (15 mg total) by mouth daily. TAKE WITH MEALS 01/17/18   Street, Willey, PA-C  Menthol, Topical Analgesic, (ICY HOT) 7.5 % (Roll) MISC Apply 1 each topically 2 (two) times daily as needed. 01/22/18   Emeline General, PA-C  methocarbamol (ROBAXIN) 500 MG tablet Take 1 tablet (500 mg total) by mouth 2 (two) times daily. 11/18/18   Kendrick, Caitlyn S, PA-C  metoCLOPramide (REGLAN) 10 MG tablet Take 1 tablet (10 mg total) by mouth every 6 (six) hours. 10/01/18   Law, Bea Graff, PA-C  naproxen (NAPROSYN) 500 MG tablet Take 1 tablet (500 mg total) by mouth 2 (two) times daily. Patient not taking: Reported on 01/17/2018 01/13/18   Petrucelli, Aldona Bar R, PA-C  naproxen (NAPROSYN) 500 MG tablet Take 1 tablet (500 mg total) by mouth 2 (two) times daily. 02/15/18   Glyn Ade, PA-C  naproxen sodium (ALEVE) 220 MG tablet Take 440 mg by mouth 2 (two) times daily as needed (For back pain.).    [provider]    Family History Family History  Problem Relation Age of Onset  . Hypertension Father     Social History Social History   Tobacco Use  . Smoking status: Current Every Day Smoker    Packs/day: 0.10    Years: 25.00    Pack years: 2.50    Types: Cigarettes  . Smokeless  tobacco: Never Used  Substance Use Topics  . Alcohol use: Yes    Comment: 6 pack/week  . Drug use: Yes    Types: Marijuana     Allergies   Codeine   Review of Systems Review of Systems  Constitutional: Negative for chills and fever.  HENT: Negative for congestion, rhinorrhea, sinus pressure and sore throat.   Eyes: Negative for pain and redness.  Respiratory: Negative for cough, shortness of breath and wheezing.   Cardiovascular: Negative for chest pain and palpitations.  Gastrointestinal: Negative for abdominal pain, constipation, diarrhea, nausea and vomiting.  Genitourinary: Negative for dysuria.   Musculoskeletal: Positive for arthralgias. Negative for back pain, myalgias and neck pain.  Skin: Negative for rash and wound.  Neurological: Positive for headaches. Negative for dizziness, syncope, weakness and numbness.  Psychiatric/Behavioral: Negative for confusion.     Physical Exam Updated Vital Signs BP (!) 152/93   Pulse 86   Temp 97.8 F (36.6 C)   Resp 15   SpO2 100%   Physical Exam Vitals signs and nursing note reviewed.  Constitutional:      General: He is not in acute distress.    Appearance: He is not ill-appearing.  HENT:     Head: Normocephalic and atraumatic.     Right Ear: Tympanic membrane and external ear normal.     Left Ear: Tympanic membrane and external ear normal.     Nose: Nose normal.     Mouth/Throat:     Mouth: Mucous membranes are moist.     Pharynx: Oropharynx is clear.  Eyes:     General: No scleral icterus.       Right eye: No discharge.        Left eye: No discharge.     Extraocular Movements: Extraocular movements intact.     Conjunctiva/sclera: Conjunctivae normal.     Pupils: Pupils are equal, round, and reactive to light.  Neck:     Musculoskeletal: Normal range of motion.     Vascular: No JVD.  Cardiovascular:     Rate and Rhythm: Normal rate and regular rhythm.     Pulses: Normal pulses.          Radial pulses are 2+ on the right side and 2+ on the left side.     Heart sounds: Normal heart sounds.  Pulmonary:     Comments: Lungs clear to auscultation in all fields. Symmetric chest rise. No wheezing, rales, or rhonchi. Abdominal:     Tenderness: There is no right CVA tenderness or left CVA tenderness.     Comments: Abdomen is soft, non-distended, and non-tender in all quadrants. No rigidity, no guarding. No peritoneal signs.  Musculoskeletal: Normal range of motion.  Skin:    General: Skin is warm and dry.     Capillary Refill: Capillary refill takes less than 2 seconds.     Findings: No rash.  Neurological:     Mental  Status: He is oriented to person, place, and time.     GCS: GCS eye subscore is 4. GCS verbal subscore is 5. GCS motor subscore is 6.     Comments: Mental Status:  Alert, oriented, thought content appropriate, able to give a coherent history. Speech fluent without evidence of aphasia. Able to follow 2 step commands without difficulty.  Cranial Nerves:  II:  Peripheral visual fields grossly normal, pupils equal, round, reactive to light [name],IV, VI: ptosis not present, extra-ocular motions intact bilaterally  V,VII: smile symmetric, facial light touch sensation  equal VIII: hearing grossly normal to voice  X: uvula elevates symmetrically  XI: bilateral shoulder shrug symmetric and strong XII: midline tongue extension without fassiculations Motor:  Normal tone. 5/5 in upper and lower extremities bilaterally including strong and equal grip strength and dorsiflexion/plantar flexion Sensory: Pinprick and light touch normal in all extremities.  Deep Tendon Reflexes: 2+ and symmetric in the biceps and patella Cerebellar: normal finger-to-nose with bilateral upper extremities Gait: normal gait and balance CV: distal pulses palpable throughout    Psychiatric:        Behavior: Behavior normal.      ED Treatments / Results  Labs (all labs ordered are listed, but only abnormal results are displayed) Labs Reviewed  COMPREHENSIVE METABOLIC PANEL - Abnormal; Notable for the following components:      Result Value   Glucose, Bld 101 (*)    All other components within normal limits  URINALYSIS, ROUTINE W REFLEX MICROSCOPIC - Abnormal; Notable for the following components:   Color, Urine STRAW (*)    All other components within normal limits  LIPASE, BLOOD  CBC WITH DIFFERENTIAL/PLATELET    EKG None  Radiology No results found.  Procedures Procedures (including critical care time)  Medications Ordered in ED Medications  metoCLOPramide (REGLAN) injection 10 mg (10 mg Intravenous Given  06/03/19 1310)  diphenhydrAMINE (BENADRYL) injection 25 mg (25 mg Intravenous Given 06/03/19 1311)  dexamethasone (DECADRON) injection 4 mg (4 mg Intravenous Given 06/03/19 1312)  sodium chloride 0.9 % bolus 1,000 mL (0 mLs Intravenous Stopped 06/03/19 1415)     Initial Impression / Assessment and Plan / ED Course  I have reviewed the triage vital signs and the nursing notes.  Pertinent labs & imaging results that were available during my care of the patient were reviewed by me and considered in my medical decision making (see chart for details).  Patient is well-appearing, no acute distress.  Abdomen is nontender to palpation, no peritoneal signs.  Given he had abdominal pain earlier this morning prior to arrival basic labs checked and are all unremarkable.  No renal insufficiency, no electrolyte derangements, LFTs normal.  UA without signs of infection.  Presentation is like pts typical HA and non concerning for [facility], ICH, Meningitis, or temporal arteritis. Pt is afebrile with no focal neuro deficits, nuchal rigidity, or change in vision. Pt HA treated with IV Benadryl, Reglan, Decadron and improved while in ED. He is tolerating p.o. intake while here in the ED.  Pt is to follow up with PCP to discuss prophylactic medication. Pt verbalizes understanding and is agreeable with plan to dc.  Recommend patient have blood pressure rechecked by primary care doctor as it is elevated today, no signs of hypertensive urgency.  Vitals:   06/03/19 1154 06/03/19 1202 06/03/19 1417  BP: (!) 152/93  (!) 138/104  Pulse: 86  72  Resp: 15    Temp: 97.8 F (36.6 C)    SpO2: 100%  95%  Weight:  97.5 kg   Height:  5\' 10"  (1.778 m)      This note was prepared using Dragon voice recognition software and may include unintentional dictation errors due to the inherent limitations of voice recognition software.    Final Clinical Impressions(s) / ED Diagnoses   Final diagnoses:  Acute nonintractable headache,  unspecified headache type    ED Discharge Orders    None       Sherene Sireslbrizze, Numa Heatwole E, PA-C 06/03/19 1452    Benjiman CorePickering, Nathan, MD 06/03/19 1523

## 2019-06-03 NOTE — ED Notes (Signed)
Patient verbalizes understanding of discharge instructions. Opportunity for questioning and answers were provided. Armband removed by staff, pt discharged from ED.  

## 2019-06-03 NOTE — ED Triage Notes (Signed)
Pt here for evaluation of abdominal pain yesterday with resolution today, headache and body aches onset yesterday. Pt denies fevers. LWBS yesterday for same.

## 2019-06-03 NOTE — ED Notes (Signed)
Pt seen getting into car by [facility] EMT.  Called pt on number in chart with no answer

## 2019-06-15 ENCOUNTER — Emergency Department (HOSPITAL_COMMUNITY): Payer: Self-pay

## 2019-06-15 ENCOUNTER — Other Ambulatory Visit: Payer: Self-pay

## 2019-06-15 ENCOUNTER — Encounter (HOSPITAL_COMMUNITY): Payer: Self-pay | Admitting: *Deleted

## 2019-06-15 ENCOUNTER — Emergency Department (HOSPITAL_COMMUNITY)
Admission: EM | Admit: 2019-06-15 | Discharge: 2019-06-15 | Disposition: A | Discharge status: Home / Self Care | Payer: Self-pay | Attending: Emergency Medicine | Admitting: Emergency Medicine

## 2019-06-15 DIAGNOSIS — H538 Other visual disturbances: Secondary | ICD-10-CM | POA: Insufficient documentation

## 2019-06-15 DIAGNOSIS — R519 Headache, unspecified: Secondary | ICD-10-CM

## 2019-06-15 DIAGNOSIS — Z20828 Contact with and (suspected) exposure to other viral communicable diseases: Secondary | ICD-10-CM | POA: Insufficient documentation

## 2019-06-15 DIAGNOSIS — R51 Headache: Secondary | ICD-10-CM | POA: Insufficient documentation

## 2019-06-15 DIAGNOSIS — F17210 Nicotine dependence, cigarettes, uncomplicated: Secondary | ICD-10-CM | POA: Insufficient documentation

## 2019-06-15 DIAGNOSIS — R5383 Other fatigue: Secondary | ICD-10-CM | POA: Insufficient documentation

## 2019-06-15 LAB — BASIC METABOLIC PANEL
Anion gap: 10 (ref 5–15)
BUN: 9 mg/dL (ref 6–20)
CO2: 26 mmol/L (ref 22–32)
Calcium: 9 mg/dL (ref 8.9–10.3)
Chloride: 103 mmol/L (ref 98–111)
Creatinine, Ser: 0.97 mg/dL (ref 0.61–1.24)
GFR calc Af Amer: >60 mL/min (ref >60)
GFR calc non Af Amer: >60 mL/min (ref >60)
Glucose, Bld: 97 mg/dL (ref 70–99)
Potassium: 4 mmol/L (ref 3.5–5.1)
Sodium: 139 mmol/L (ref 135–145)

## 2019-06-15 LAB — CBC
HCT: 46.8 % (ref 39.0–52.0)
Hemoglobin: 15.7 g/dL (ref 13.0–17.0)
MCH: 30.4 pg (ref 26.0–34.0)
MCHC: 33.5 g/dL (ref 30.0–36.0)
MCV: 90.7 fL (ref 80.0–100.0)
Platelets: 291 10*3/uL (ref 150–400)
RBC: 5.16 MIL/uL (ref 4.22–5.81)
RDW: 12.4 % (ref 11.5–15.5)
WBC: 9.2 10*3/uL (ref 4.0–10.5)
nRBC: 0 % (ref 0.0–0.2)

## 2019-06-15 LAB — URINALYSIS, ROUTINE W REFLEX MICROSCOPIC
Bilirubin Urine: NEGATIVE
Glucose, UA: NEGATIVE mg/dL
Hgb urine dipstick: NEGATIVE
Ketones, ur: NEGATIVE mg/dL
Leukocytes,Ua: NEGATIVE
Nitrite: NEGATIVE
Protein, ur: NEGATIVE mg/dL
Specific Gravity, Urine: 1.011 (ref 1.005–1.030)
pH: 6 (ref 5.0–8.0)

## 2019-06-15 LAB — CBG MONITORING, ED: Glucose-Capillary: 92 mg/dL (ref 70–99)

## 2019-06-15 LAB — SARS CORONAVIRUS 2 BY RT PCR (HOSPITAL ORDER, PERFORMED IN ~~LOC~~ HOSPITAL LAB): SARS Coronavirus 2: NEGATIVE

## 2019-06-15 MED ORDER — SODIUM CHLORIDE 0.9% FLUSH
3.0000 mL | Freq: Once | INTRAVENOUS | Status: AC
  Administered 2019-06-15: 3 mL via INTRAVENOUS

## 2019-06-15 MED ORDER — SODIUM CHLORIDE 0.9 % IV BOLUS
1000.0000 mL | Freq: Once | INTRAVENOUS | Status: AC
  Administered 2019-06-15: 1000 mL via INTRAVENOUS

## 2019-06-15 MED ORDER — DEXAMETHASONE SODIUM PHOSPHATE 10 MG/ML IJ SOLN
10.0000 mg | Freq: Once | INTRAMUSCULAR | Status: AC
  Administered 2019-06-15: 10 mg via INTRAVENOUS
  Filled 2019-06-15: qty 1

## 2019-06-15 MED ORDER — ACETAMINOPHEN 325 MG PO TABS
650.0000 mg | ORAL_TABLET | Freq: Once | ORAL | Status: AC
  Administered 2019-06-15: 650 mg via ORAL
  Filled 2019-06-15: qty 2

## 2019-06-15 MED ORDER — DIPHENHYDRAMINE HCL 50 MG/ML IJ SOLN
25.0000 mg | Freq: Once | INTRAMUSCULAR | Status: AC
  Administered 2019-06-15: 25 mg via INTRAVENOUS
  Filled 2019-06-15: qty 1

## 2019-06-15 MED ORDER — ONDANSETRON HCL 4 MG/2ML IJ SOLN
4.0000 mg | Freq: Once | INTRAMUSCULAR | Status: AC
  Administered 2019-06-15: 13:00:00 4 mg via INTRAVENOUS
  Filled 2019-06-15: qty 2

## 2019-06-15 MED ORDER — KETOROLAC TROMETHAMINE 15 MG/ML IJ SOLN
15.0000 mg | Freq: Once | INTRAMUSCULAR | Status: AC
  Administered 2019-06-15: 15 mg via INTRAVENOUS
  Filled 2019-06-15: qty 1

## 2019-06-15 NOTE — ED Triage Notes (Signed)
Pt in c/o dizziness and diaphoresis x 2 days with body aches, denies CP & SOB, c/o blurred vision onset today, A&O x4

## 2019-06-15 NOTE — ED Notes (Signed)
MRI called will send for transport now.

## 2019-06-15 NOTE — ED Notes (Signed)
Pt discharged with all belongings. Discharge instructions reviewed with pt, and pt verbalized understanding. Opportunity for questions provided.  

## 2019-06-15 NOTE — ED Provider Notes (Signed)
[facility] Provider Note   CSN: [id number] Arrival date & time: 06/15/19  0703    History   Chief Complaint Chief Complaint  Patient presents with  . Dizziness    HPI Joseph [name] is a 53 y.o. male to emergency department today with chief complaint of headache and blurry vision.  Patient states blurry visionstarted 4 hours prior to arrival, it improved since onset but is still present.  Patient has had headaches for the last 2 weeks, stating he gets them daily.  He has taken aspirin with transient relief.  He states headache is located throughout his head and has a throbbing sensation.  He rates it 7 out of 10 in severity.  His headache has progressively worsened since onset.  He reports associated photophobia and fatigue.   He denies any known sick contacts.  Denies fever, syncope, head trauma, nausea vomiting, neck pain, rash, seizure, abdominal pain, urinary symptoms, diarrhea, numbness, weakness.  Patient was seen on 718/20 in ED by myself with headache and body aches.  He had unremarkable labs, was given headache cocktail discharged home.  No head CT ordered as patient had normal neuro exam.    Past Medical History:  Diagnosis Date  . Hypertension     There are no active problems to display for this patient.   History reviewed. No pertinent surgical history.      Home Medications    Prior to Admission medications   Not on File    Family History Family History  Problem Relation Age of Onset  . Hypertension Father     Social History Social History   Tobacco Use  . Smoking status: Current Every Day Smoker    Packs/day: 0.10    Years: 25.00    Pack years: 2.50    Types: Cigarettes  . Smokeless tobacco: Never Used  Substance Use Topics  . Alcohol use: Yes    Comment: 6 pack/week  . Drug use: Yes    Types: Marijuana, Cocaine     Allergies   Codeine   Review of Systems Review of Systems   Constitutional: Positive for fatigue. Negative for chills and fever.  HENT: Negative for congestion, rhinorrhea, sinus pressure and sore throat.   Eyes: Positive for visual disturbance. Negative for pain and redness.  Respiratory: Negative for cough, shortness of breath and wheezing.   Cardiovascular: Negative for chest pain and palpitations.  Gastrointestinal: Negative for abdominal pain, constipation, diarrhea, nausea and vomiting.  Genitourinary: Negative for dysuria.  Musculoskeletal: Negative for arthralgias, back pain, myalgias and neck pain.  Skin: Negative for rash and wound.  Neurological: Positive for headaches. Negative for dizziness, syncope, weakness and numbness.  Psychiatric/Behavioral: Negative for confusion.     Physical Exam Updated Vital Signs BP 139/87   Pulse 66   Temp 98.2 F (36.8 C) (Oral)   Resp 15   SpO2 100%   Physical Exam Vitals signs and nursing note reviewed.  Constitutional:      General: He is not in acute distress.    Appearance: He is not ill-appearing.  HENT:     Head: Normocephalic and atraumatic.     Comments: No sinus or temporal tenderness.    Right Ear: Tympanic membrane and external ear normal.     Left Ear: Tympanic membrane and external ear normal.     Nose: Nose normal.     Mouth/Throat:     Mouth: Mucous membranes are moist.     Pharynx: Oropharynx  is clear.  Eyes:     General: No scleral icterus.       Right eye: No discharge.        Left eye: No discharge.     Extraocular Movements: Extraocular movements intact.     Conjunctiva/sclera: Conjunctivae normal.     Pupils: Pupils are equal, round, and reactive to light.  Neck:     Musculoskeletal: Normal range of motion.     Vascular: No JVD.  Cardiovascular:     Rate and Rhythm: Normal rate and regular rhythm.     Pulses: Normal pulses.          Radial pulses are 2+ on the right side and 2+ on the left side.     Heart sounds: Normal heart sounds.  Pulmonary:      Comments: Lungs clear to auscultation in all fields. Symmetric chest rise. No wheezing, rales, or rhonchi. Abdominal:     Comments: Abdomen is soft, non-distended, and non-tender in all quadrants. No rigidity, no guarding. No peritoneal signs.  Musculoskeletal: Normal range of motion.  Skin:    General: Skin is warm and dry.     Capillary Refill: Capillary refill takes less than 2 seconds.  Neurological:     Mental Status: He is oriented to person, place, and time.     GCS: GCS eye subscore is 4. GCS verbal subscore is 5. GCS motor subscore is 6.     Comments: Mental Status:  Alert, oriented, thought content appropriate, able to give a coherent history. Speech fluent without evidence of aphasia. Able to follow 2 step commands without difficulty.  Cranial Nerves:  II:  Peripheral visual fields grossly normal, pupils equal, round, reactive to light [name],IV, VI: ptosis not present, extra-ocular motions intact bilaterally  V,VII: smile symmetric, facial light touch sensation equal VIII: hearing grossly normal to voice  X: uvula elevates symmetrically  XI: bilateral shoulder shrug symmetric and strong XII: midline tongue extension without fassiculations Motor:  Normal tone. 5/5 in upper and lower extremities bilaterally including strong and equal grip strength and dorsiflexion/plantar flexion Sensory: Pinprick and light touch normal in all extremities.  Deep Tendon Reflexes: 2+ and symmetric in the biceps and patella Cerebellar: normal finger-to-nose with bilateral upper extremities Gait: normal gait and balance CV: distal pulses palpable throughout   Psychiatric:        Behavior: Behavior normal.      ED Treatments / Results  Labs (all labs ordered are listed, but only abnormal results are displayed) Labs Reviewed  SARS CORONAVIRUS 2 (HOSPITAL ORDER, PERFORMED IN [place] [facility])  BASIC METABOLIC PANEL  CBC  URINALYSIS, ROUTINE W REFLEX MICROSCOPIC  CBG MONITORING, ED     EKG EKG Interpretation  Date/Time:  Thursday June 15 2019 07:21:22 EDT Ventricular Rate:  75 PR Interval:  118 QRS Duration: 84 QT Interval:  386 QTC Calculation: 431 R Axis:   58 Text Interpretation:  Normal sinus rhythm Minimal voltage criteria for LVH, may be normal variant Cannot rule out Anterior infarct , age undetermined Abnormal ECG Confirmed by Geoffery LyonseLo, Douglas ([id number]) on 06/15/2019 12:18:17 PM   Radiology No results found.  Procedures Procedures (including critical care time)  Medications Ordered in ED Medications  sodium chloride flush (NS) 0.9 % injection 3 mL (3 mLs Intravenous Given 06/15/19 1721)  sodium chloride 0.9 % bolus 1,000 mL (0 mLs Intravenous Stopped 06/15/19 1415)  ondansetron (ZOFRAN) injection 4 mg (4 mg Intravenous Given 06/15/19 1254)  diphenhydrAMINE (BENADRYL) injection 25 mg (  25 mg Intravenous Given 06/15/19 1252)  acetaminophen (TYLENOL) tablet 650 mg (650 mg Oral Given 06/15/19 1248)  dexamethasone (DECADRON) injection 10 mg (10 mg Intravenous Given 06/15/19 1714)  ketorolac (TORADOL) 15 MG/ML injection 15 mg (15 mg Intravenous Given 06/15/19 1714)     Initial Impression / Assessment and Plan / ED Course  I have reviewed the triage vital signs and the nursing notes.  Pertinent labs & imaging results that were available during my care of the patient were reviewed by me and considered in my medical decision making (see chart for details).  53 yo male presents with headache, fatigue, and blurry vision. He is afebrile, in no acute distress. Neuro exam without focal deficit however he continues to have blurry vision.No nuchal rigidity. Labs are unremarkable, no leukocytosis, electrolyte derangements, renal insufficieny. UA without signs of infection. Covid test negative. MRI without acute findings.  Pt HA treated and improved while in ED. On reassessment pt reports blurry vision has resolved. Pt is to follow up with PCP to discuss prophylactic medication. Pt  verbalizes understanding and is agreeable with plan to dc. Strict ED return precautions discussed. Findings and plan of care discussed with supervising physician Dr. Stark Jock.  This note was prepared using Dragon voice recognition software and may include unintentional dictation errors due to the inherent limitations of voice recognition software.     Final Clinical Impressions(s) / ED Diagnoses   Final diagnoses:  Nonintractable headache, unspecified chronicity pattern, unspecified headache type    ED Discharge Orders    None       Flint Melter 06/17/19 1951    Veryl Speak, MD 06/18/19 [phone]

## 2019-06-15 NOTE — Discharge Instructions (Addendum)
You have been seen today for headache and blurry vision. Please read and follow all provided instructions. Return to the emergency room for worsening condition or new concerning symptoms.    1. Medications:  Please take tylenol for pain as needed for your headache, Continue usual home medications Take medications as prescribed. Please review all of the medicines and only take them if you do not have an allergy to them.   2. Treatment: rest, drink plenty of fluids  3. Follow Up: Please follow up with your primary doctor in 2-5 days for discussion of your diagnoses and further evaluation after today's visit; Call today to arrange your follow up.  If you do not have a primary care doctor use the resource guide provided to find one;  -I have include information for [facility].  Please follow-up with the clinic.  They see patients without insurance.  Please call the number listed in the paperwork to schedule follow-up appointment.  It is also a possibility that you have an allergic reaction to any of the medicines that you have been prescribed - Everybody reacts differently to medications and while MOST people have no trouble with most medicines, you may have a reaction such as nausea, vomiting, rash, swelling, shortness of breath. If this is the case, please stop taking the medicine immediately and contact your physician.  ?

## 2020-08-04 ENCOUNTER — Other Ambulatory Visit: Payer: Self-pay

## 2020-08-04 ENCOUNTER — Emergency Department (HOSPITAL_COMMUNITY)
Admission: EM | Admit: 2020-08-04 | Discharge: 2020-08-04 | Disposition: A | Discharge status: Home / Self Care | Payer: Self-pay | Attending: Emergency Medicine | Admitting: Emergency Medicine

## 2020-08-04 ENCOUNTER — Encounter (HOSPITAL_COMMUNITY): Payer: Self-pay

## 2020-08-04 ENCOUNTER — Emergency Department (HOSPITAL_COMMUNITY): Payer: Self-pay

## 2020-08-04 DIAGNOSIS — R1013 Epigastric pain: Secondary | ICD-10-CM | POA: Insufficient documentation

## 2020-08-04 DIAGNOSIS — I10 Essential (primary) hypertension: Secondary | ICD-10-CM | POA: Insufficient documentation

## 2020-08-04 DIAGNOSIS — R197 Diarrhea, unspecified: Secondary | ICD-10-CM | POA: Insufficient documentation

## 2020-08-04 DIAGNOSIS — F17210 Nicotine dependence, cigarettes, uncomplicated: Secondary | ICD-10-CM | POA: Insufficient documentation

## 2020-08-04 DIAGNOSIS — Z20822 Contact with and (suspected) exposure to covid-19: Secondary | ICD-10-CM | POA: Insufficient documentation

## 2020-08-04 DIAGNOSIS — R0789 Other chest pain: Secondary | ICD-10-CM | POA: Insufficient documentation

## 2020-08-04 DIAGNOSIS — R0602 Shortness of breath: Secondary | ICD-10-CM | POA: Insufficient documentation

## 2020-08-04 LAB — BASIC METABOLIC PANEL
Anion gap: 11 (ref 5–15)
BUN: 10 mg/dL (ref 6–20)
CO2: 23 mmol/L (ref 22–32)
Calcium: 8.6 mg/dL — ABNORMAL LOW (ref 8.9–10.3)
Chloride: 107 mmol/L (ref 98–111)
Creatinine, Ser: 1.21 mg/dL (ref 0.61–1.24)
GFR calc Af Amer: >60 mL/min (ref >60)
GFR calc non Af Amer: >60 mL/min (ref >60)
Glucose, Bld: 171 mg/dL — ABNORMAL HIGH (ref 70–99)
Potassium: 3.9 mmol/L (ref 3.5–5.1)
Sodium: 141 mmol/L (ref 135–145)

## 2020-08-04 LAB — TROPONIN I (HIGH SENSITIVITY)
Troponin I (High Sensitivity): 7 ng/L (ref <18)
Troponin I (High Sensitivity): 6 ng/L (ref <18)

## 2020-08-04 LAB — CBC
HCT: 48.2 % (ref 39.0–52.0)
Hemoglobin: 16.2 g/dL (ref 13.0–17.0)
MCH: 30.2 pg (ref 26.0–34.0)
MCHC: 33.6 g/dL (ref 30.0–36.0)
MCV: 89.9 fL (ref 80.0–100.0)
Platelets: 258 10*3/uL (ref 150–400)
RBC: 5.36 MIL/uL (ref 4.22–5.81)
RDW: 12.9 % (ref 11.5–15.5)
WBC: 9.1 10*3/uL (ref 4.0–10.5)
nRBC: 0 % (ref 0.0–0.2)

## 2020-08-04 LAB — SARS CORONAVIRUS 2 BY RT PCR (HOSPITAL ORDER, PERFORMED IN ~~LOC~~ HOSPITAL LAB): SARS Coronavirus 2: NEGATIVE

## 2020-08-04 MED ORDER — ACETAMINOPHEN 500 MG PO TABS
1000.0000 mg | ORAL_TABLET | Freq: Once | ORAL | Status: AC
  Administered 2020-08-04: 1000 mg via ORAL
  Filled 2020-08-04: qty 2

## 2020-08-04 MED ORDER — LISINOPRIL 10 MG PO TABS: 10.0000 mg | ORAL_TABLET | Freq: Every day | ORAL | 1 refills | Status: DC

## 2020-08-04 NOTE — ED Triage Notes (Signed)
Pt arrives to ED w/ c/o sob that started approx 24 hours ago. Pt also endorses 6/10 centrally located chest pain, cough, and n/v/d. Pt denies fever, chills. States he has had 1 of his covid shots. Resp e/u.

## 2020-08-04 NOTE — ED Provider Notes (Signed)
Medical screening examination/treatment/procedure(s) were conducted as a shared visit with non-physician practitioner(s) and myself.  I personally evaluated the patient during the encounter.  EKG Interpretation  Date/Time:  Sunday August 04 2020 02:12:48 EDT Ventricular Rate:  94 PR Interval:  118 QRS Duration: 86 QT Interval:  338 QTC Calculation: 422 R Axis:   48 Text Interpretation: Normal sinus rhythm Minimal voltage criteria for LVH, may be normal variant ( Sokolow-Lyon ) Septal infarct , age undetermined Abnormal ECG no sig change from previous Confirmed by Arby Barrette [phone]) on 08/04/2020 9:21:56 AM  Patient developed chest pain yesterday when he lifted up a heavy car tire.  He describes lifting it straight in front of him with his arms extended.  After that he has been experiencing chest discomfort and shortness of breath for about 24 hours.  No fever, no cough.  No lower extremity swelling or calf pain. Patient does smoke.  He reports he has been told he has hypertension several years ago but has never been on medications.  Occasional use of cocaine.  Last use about 3 days prior to onset of chest pain.  Patient is alert nontoxic.  Well in appearance.  Heart regular no rub murmur gallop.  Lungs are clear without wheeze rhonchi or rale.  Abdomen is soft and nontender.  Lower extremity without calf tenderness or swelling.  EKG without acute ischemic changes.  I agree with 2 sets of troponins and rule out for MI.  I do not suspect dissection or other vascular emergency.  Pain is diffuse across the lower chest.  I have counseled patient on importance of follow-up with PCP with blood pressure management, discussion for outpatient stress testing due to risk of cardiovascular disease, cessation of smoking and the occasional use of cocaine.  I have reviewed risks of heart attack, stroke and kidney failure.   Arby Barrette, MD 08/04/20 1058

## 2020-08-04 NOTE — ED Provider Notes (Addendum)
[facility] EMERGENCY DEPARTMENT Provider Note   CSN: [id number] Arrival date & time: 08/04/20  0122     History Chief Complaint  Patient presents with  . Shortness of Breath    Joseph [name] is a 54 y.o. male.  HPI Patient is a 54 year old male with a history of hypertension.  Patient states yesterday morning he was working on his car and began feeling short of breath.  States it is worse with exertion.  Feels that has been constant for the past 24 hours but has began to improve since arriving to the emergency department.  He additionally notes some dull, diffuse, aching, central chest pain.  This started suddenly while he was lifting a heavy tire.  It is worse with palpation and movement of his upper extremities.  He reports some intermittent diarrhea as well.  No hematochezia.  Mild epigastric pain that started while waiting in the ED.  No fevers, chills, URI symptoms, n/v/c, leg swelling, recent travel, recent surgeries, immobilization, history of blood clots.  Patient states he smokes about 1/4 pack/day. Pt additionally notes cocaine use about three days prior to the onset of his sx.     Past Medical History:  Diagnosis Date  . Hypertension     There are no problems to display for this patient.   History reviewed. No pertinent surgical history.     Family History  Problem Relation Age of Onset  . Hypertension Father     Social History   Tobacco Use  . Smoking status: Current Every Day Smoker    Packs/day: 0.10    Years: 25.00    Pack years: 2.50    Types: Cigarettes  . Smokeless tobacco: Never Used  Vaping Use  . Vaping Use: Never used  Substance Use Topics  . Alcohol use: Yes    Comment: 6 pack/week  . Drug use: Yes    Types: Marijuana, Cocaine    Home Medications Prior to Admission medications   Not on File    Allergies    Codeine  Review of Systems   Review of Systems  All other systems reviewed and are negative. Ten  systems reviewed and are negative for acute change, except as noted in the HPI.   Physical Exam Updated Vital Signs BP 136/84   Pulse 89   Temp 98.4 F (36.9 C) (Oral)   Resp 17   Ht 5\' 10"  (1.778 m)   Wt 97 kg   SpO2 97%   BMI 30.68 kg/m   Physical Exam Vitals and nursing note reviewed.  Constitutional:      General: He is not in acute distress.    Appearance: Normal appearance. He is well-developed and normal weight. He is not ill-appearing, toxic-appearing or diaphoretic.  HENT:     Head: Normocephalic and atraumatic.     Right Ear: External ear normal.     Left Ear: External ear normal.     Nose: Nose normal.     Mouth/Throat:     Mouth: Mucous membranes are moist.     Pharynx: Oropharynx is clear. No oropharyngeal exudate or posterior oropharyngeal erythema.  Eyes:     Extraocular Movements: Extraocular movements intact.  Cardiovascular:     Rate and Rhythm: Normal rate and regular rhythm.     Pulses: Normal pulses.     Heart sounds: Normal heart sounds. No murmur heard.  No friction rub. No gallop.      Comments: Heart is regular rate and rhythm.  No tachycardia.  No murmurs, rubs, gallops.  Mild anterior chest wall tenderness noted.  No crepitus. Pulmonary:     Effort: Pulmonary effort is normal. No tachypnea, bradypnea or respiratory distress.     Breath sounds: Normal breath sounds. No stridor. No decreased breath sounds, wheezing, rhonchi or rales.     Comments: Lungs are clear to auscultation bilaterally.  Oxygen saturations around 98% while conversing with me.  Oxygen saturations fluctuate between 97 to 99% while ambulating in place and conversing with me. Chest:     Chest wall: Tenderness present.  Abdominal:     General: Abdomen is flat.     Palpations: Abdomen is soft.     Tenderness: There is no abdominal tenderness.     Comments: Abdomen is soft. Minimal tenderness noted with deep palpation along the epigastrium.  Musculoskeletal:        General: Normal  range of motion.     Cervical back: Normal range of motion and neck supple. No tenderness.     Right lower leg: No tenderness. No edema.     Left lower leg: No tenderness. No edema.     Comments: No leg swelling.  Palpable pedal pulses.  No calf pain.  Skin:    General: Skin is warm and dry.  Neurological:     General: No focal deficit present.     Mental Status: He is alert and oriented to person, place, and time.  Psychiatric:        Mood and Affect: Mood normal.        Behavior: Behavior normal.     ED Results / Procedures / Treatments   Labs (all labs ordered are listed, but only abnormal results are displayed) Labs Reviewed  BASIC METABOLIC PANEL - Abnormal; Notable for the following components:      Result Value   Glucose, Bld 171 (*)    Calcium 8.6 (*)    All other components within normal limits  SARS CORONAVIRUS 2 BY RT PCR (HOSPITAL ORDER, PERFORMED IN [facility])  CBC  TROPONIN I (HIGH SENSITIVITY)  TROPONIN I (HIGH SENSITIVITY)   EKG EKG Interpretation  Date/Time:  Sunday August 04 2020 02:12:48 EDT Ventricular Rate:  94 PR Interval:  118 QRS Duration: 86 QT Interval:  338 QTC Calculation: 422 R Axis:   48 Text Interpretation: Normal sinus rhythm Minimal voltage criteria for LVH, may be normal variant ( Sokolow-Lyon ) Septal infarct , age undetermined Abnormal ECG no sig change from previous Confirmed by Arby Barrette [phone]) on 08/04/2020 9:21:56 AM  Radiology DG Chest Portable 1 View  Result Date: 08/04/2020 CLINICAL DATA:  Shortness of breath, central chest pain EXAM: PORTABLE CHEST 1 VIEW COMPARISON:  11/18/2018 FINDINGS: Single frontal view of the chest demonstrates an unremarkable cardiac silhouette. No airspace disease, effusion, or pneumothorax. No acute bony abnormality. IMPRESSION: 1. No acute intrathoracic process. Electronically Signed   By: Sharlet Salina M.D.   On: 08/04/2020 02:34   Procedures Procedures (including critical  care time)  Medications Ordered in ED Medications  acetaminophen (TYLENOL) tablet 1,000 mg (1,000 mg Oral Given 08/04/20 6045)   ED Course  I have reviewed the triage vital signs and the nursing notes.  Pertinent labs & imaging results that were available during my care of the patient were reviewed by me and considered in my medical decision making (see chart for details).  Clinical Course as of Aug 04 1012  Sun Aug 04, 2020  0919 SARS Coronavirus 2:  NEGATIVE [LJ]  0920 Troponin I (High Sensitivity): 7 [LJ]  1008 Troponin I (High Sensitivity): 6 [LJ]    Clinical Course User Index [LJ] Placido Sou, PA-C   MDM Rules/Calculators/A&P                          Pt is a 54 y.o. male that presents with a history, physical exam, and ED Clinical Course as noted above.   Patient presents today with some mild chest pain and shortness of breath.  His symptoms have been ongoing for the past 24 hours but seem to have improved since arriving to the emergency department.  His lab work today is quite reassuring.  His COVID-19 test is negative.  His troponin is negative x2.  No electrolyte abnormalities.  ECG reassuring.  Chest x-ray negative. Well's Criteria for DVT/PE r/o is 0.   Patient notes a history of smoking as well as sporadic cocaine use.  His most recent cocaine use was 3 days ago.  We noted some mild hypertension here in the emergency department.  We discussed his drug use and smoking in length.  Recommended cessation.  Will start patient on a low-dose of lisinopril.  No elevation in creatinine.  GFR greater than 60.  Patient is not insured.  We will additionally give a referral to community health and wellness.  We will also give a referral to cardiology.  Patient is amenable with this plan.  Patient is hemodynamically stable and in NAD at the time of d/c. Evaluation does not show pathology that would require ongoing emergent intervention or inpatient treatment. I explained the  diagnosis to the patient. Patient is comfortable with above plan and is stable for discharge at this time. All questions were answered prior to disposition. Strict return precautions for returning to the ED were discussed. Encouraged follow up with PCP.   Pt discussed with and evaluated by my attending physician who agrees with the above plan.    An After Visit Summary was printed and given to the patient.  Patient discharged to home/self care.  Condition at discharge: Stable  Note: Portions of this report may have been transcribed using voice recognition software. Every effort was made to ensure accuracy; however, inadvertent computerized transcription errors may be present.   Final Clinical Impression(s) / ED Diagnoses Final diagnoses:  SOB (shortness of breath)   Rx / DC Orders ED Discharge Orders         Ordered    lisinopril (ZESTRIL) 10 MG tablet  Daily        08/04/20 1013           Placido Sou, PA-C 08/04/20 1017    Placido Sou, PA-C 08/04/20 1027    Arby Barrette, MD 08/04/20 1058

## 2020-08-04 NOTE — Discharge Instructions (Signed)
I prescribed you a medication called lisinopril.  You are going to take this once a day for your blood pressure.  I have also given you referrals to community health and wellness as well as heart care.  This is a primary care office as well as a cardiology office.  Please make appointments at both of these places to be evaluated.  If you develop worsening symptoms, you need to return to the ER for reevaluation.  It was a pleasure to meet you.

## 2020-10-01 ENCOUNTER — Encounter (HOSPITAL_COMMUNITY): Payer: Self-pay

## 2020-10-01 ENCOUNTER — Emergency Department (HOSPITAL_COMMUNITY)
Admission: EM | Admit: 2020-10-01 | Discharge: 2020-10-01 | Disposition: A | Discharge status: Home / Self Care | Payer: Self-pay | Attending: Emergency Medicine | Admitting: Emergency Medicine

## 2020-10-01 DIAGNOSIS — I10 Essential (primary) hypertension: Secondary | ICD-10-CM | POA: Insufficient documentation

## 2020-10-01 DIAGNOSIS — Z79899 Other long term (current) drug therapy: Secondary | ICD-10-CM | POA: Insufficient documentation

## 2020-10-01 DIAGNOSIS — S61012A Laceration without foreign body of left thumb without damage to nail, initial encounter: Secondary | ICD-10-CM | POA: Insufficient documentation

## 2020-10-01 DIAGNOSIS — F17210 Nicotine dependence, cigarettes, uncomplicated: Secondary | ICD-10-CM | POA: Insufficient documentation

## 2020-10-01 DIAGNOSIS — W228XXA Striking against or struck by other objects, initial encounter: Secondary | ICD-10-CM | POA: Insufficient documentation
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
## 2021-04-16 ENCOUNTER — Emergency Department (HOSPITAL_COMMUNITY): Payer: Self-pay

## 2021-04-16 ENCOUNTER — Other Ambulatory Visit: Payer: Self-pay

## 2021-04-16 ENCOUNTER — Encounter (HOSPITAL_COMMUNITY): Payer: Self-pay | Admitting: Emergency Medicine

## 2021-04-16 ENCOUNTER — Emergency Department (HOSPITAL_COMMUNITY)
Admission: EM | Admit: 2021-04-16 | Discharge: 2021-04-16 | Disposition: A | Discharge status: Home / Self Care | Payer: Self-pay | Attending: Emergency Medicine | Admitting: Emergency Medicine

## 2021-04-16 DIAGNOSIS — K2900 Acute gastritis without bleeding: Secondary | ICD-10-CM

## 2021-04-16 DIAGNOSIS — R112 Nausea with vomiting, unspecified: Secondary | ICD-10-CM | POA: Insufficient documentation

## 2021-04-16 DIAGNOSIS — R197 Diarrhea, unspecified: Secondary | ICD-10-CM | POA: Insufficient documentation

## 2021-04-16 DIAGNOSIS — I10 Essential (primary) hypertension: Secondary | ICD-10-CM | POA: Insufficient documentation

## 2021-04-16 DIAGNOSIS — Z79899 Other long term (current) drug therapy: Secondary | ICD-10-CM | POA: Insufficient documentation

## 2021-04-16 DIAGNOSIS — R1084 Generalized abdominal pain: Secondary | ICD-10-CM | POA: Insufficient documentation

## 2021-04-16 DIAGNOSIS — F17210 Nicotine dependence, cigarettes, uncomplicated: Secondary | ICD-10-CM | POA: Insufficient documentation

## 2021-04-16 DIAGNOSIS — R1013 Epigastric pain: Secondary | ICD-10-CM | POA: Insufficient documentation

## 2021-04-16 LAB — COMPREHENSIVE METABOLIC PANEL WITH GFR
ALT: 19 U/L (ref 0–44)
AST: 27 U/L (ref 15–41)
Albumin: 3.5 g/dL (ref 3.5–5.0)
Alkaline Phosphatase: 65 U/L (ref 38–126)
Anion gap: 10 (ref 5–15)
BUN: 10 mg/dL (ref 6–20)
CO2: 22 mmol/L (ref 22–32)
Calcium: 8.6 mg/dL — ABNORMAL LOW (ref 8.9–10.3)
Chloride: 103 mmol/L (ref 98–111)
Creatinine, Ser: 1.23 mg/dL (ref 0.61–1.24)
GFR, Estimated: >60 mL/min
Glucose, Bld: 226 mg/dL — ABNORMAL HIGH (ref 70–99)
Potassium: 3.4 mmol/L — ABNORMAL LOW (ref 3.5–5.1)
Sodium: 135 mmol/L (ref 135–145)
Total Bilirubin: 1.2 mg/dL (ref 0.3–1.2)
Total Protein: 6.1 g/dL — ABNORMAL LOW (ref 6.5–8.1)

## 2021-04-16 LAB — CBC WITH DIFFERENTIAL/PLATELET
Abs Immature Granulocytes: 0.08 10*3/uL — ABNORMAL HIGH (ref 0.00–0.07)
Basophils Absolute: 0 10*3/uL (ref 0.0–0.1)
Basophils Relative: 0 %
Eosinophils Absolute: 0.1 10*3/uL (ref 0.0–0.5)
Eosinophils Relative: 1 %
HCT: 44.7 % (ref 39.0–52.0)
Hemoglobin: 15.2 g/dL (ref 13.0–17.0)
Immature Granulocytes: 1 %
Lymphocytes Relative: 8 %
Lymphs Abs: 1 10*3/uL (ref 0.7–4.0)
MCH: 31 pg (ref 26.0–34.0)
MCHC: 34 g/dL (ref 30.0–36.0)
MCV: 91 fL (ref 80.0–100.0)
Monocytes Absolute: 0.9 10*3/uL (ref 0.1–1.0)
Monocytes Relative: 8 %
Neutro Abs: 9.4 10*3/uL — ABNORMAL HIGH (ref 1.7–7.7)
Neutrophils Relative %: 82 %
Platelets: 241 10*3/uL (ref 150–400)
RBC: 4.91 MIL/uL (ref 4.22–5.81)
RDW: 12.8 % (ref 11.5–15.5)
WBC: 11.4 10*3/uL — ABNORMAL HIGH (ref 4.0–10.5)
nRBC: 0 % (ref 0.0–0.2)

## 2021-04-16 LAB — LIPASE, BLOOD: Lipase: 25 U/L (ref 11–51)

## 2021-04-16 MED ORDER — ALUM & MAG HYDROXIDE-SIMETH 400-400-40 MG/5ML PO SUSP: 5.0000 mL | Freq: Four times a day (QID) | ORAL | 0 refills | Status: AC | PRN

## 2021-04-16 MED ORDER — ALUM & MAG HYDROXIDE-SIMETH 200-200-20 MG/5ML PO SUSP
30.0000 mL | Freq: Once | ORAL | Status: AC
  Administered 2021-04-16: 30 mL via ORAL
  Filled 2021-04-16: qty 30

## 2021-04-16 MED ORDER — ONDANSETRON HCL 4 MG/2ML IJ SOLN
4.0000 mg | Freq: Once | INTRAMUSCULAR | Status: AC
  Administered 2021-04-16: 4 mg via INTRAVENOUS
  Filled 2021-04-16: qty 2

## 2021-04-16 MED ORDER — FAMOTIDINE IN NACL 20-0.9 MG/50ML-% IV SOLN
20.0000 mg | Freq: Once | INTRAVENOUS | Status: AC
  Administered 2021-04-16: 20 mg via INTRAVENOUS
  Filled 2021-04-16: qty 50

## 2021-04-16 MED ORDER — ACETAMINOPHEN 325 MG PO TABS
650.0000 mg | ORAL_TABLET | Freq: Once | ORAL | Status: AC
  Administered 2021-04-16: 650 mg via ORAL
  Filled 2021-04-16: qty 2

## 2021-04-16 MED ORDER — FAMOTIDINE 20 MG PO TABS: 20.0000 mg | ORAL_TABLET | Freq: Two times a day (BID) | ORAL | 0 refills | Status: DC

## 2021-04-16 NOTE — Discharge Instructions (Signed)
Dear. Joseph [name],   It was a pleasure meeting you. Your labs and CT looked normal. Please take acetaminophen as needed for pain, Pepcid 20 mg twice daily, and maalox as needed for the stomach pain. Avoid alcohol and avoid spicy and acidic foods. Your pain should gradually get better with time.

## 2021-04-16 NOTE — ED Provider Notes (Signed)
[facility] EMERGENCY DEPARTMENT Provider Note   CSN: [id number] Arrival date & time: 04/16/21  0944     History Chief Complaint  Patient presents with  . Abdominal Pain    Joseph [name] is a 55 y.o. male with a history of hypertension who presents for acute epigastric pain that started around 1:30 pm yesterday. Patient states pain is burning and cramping lasting about 30 secondary every 2-5 minutes. Has had 2 episodes of clear non bilious emesis yesterday and once today. 3 episode of liquid stools yesterday and twice today. Has been able to tolerate fluids and small amounts of food. Has not taken anything for pain.  Denies any fever, chills, melena, blood in stool or emesis, dysuria, chest pain, or shortness of breath. Does not he had similar pain in 2004 state he had a bacterial infection of his bowels but cannot remember any details.     Past Medical History:  Diagnosis Date  . Hypertension    Denies past abdominal surgeries.   Family History  Problem Relation Age of Onset  . Hypertension Father     Social History   Tobacco Use  . Smoking status: Current Every Day Smoker    Packs/day: 0.10    Years: 25.00    Pack years: 2.50    Types: Cigarettes  . Smokeless tobacco: Never Used  Vaping Use  . Vaping Use: Never used  Substance Use Topics  . Alcohol use: Yes    Comment: 6 pack/week  . Drug use: Yes    Types: Marijuana, Cocaine    Home Medications Prior to Admission medications   Medication Sig Start Date End Date Taking? Authorizing Provider  lisinopril (ZESTRIL) 10 MG tablet Take 1 tablet (10 mg total) by mouth daily. Patient not taking: Reported on 04/16/2021 08/04/20   Placido Sou, PA-C    Allergies    Codeine  Review of Systems   Review of Systems  Constitutional: Positive for appetite change. Negative for chills and fever.  HENT: Negative for congestion and sore throat.   Eyes: Negative for photophobia and pain.   Respiratory: Negative for shortness of breath and wheezing.   Cardiovascular: Negative for chest pain and palpitations.  Gastrointestinal: Positive for abdominal pain, diarrhea, nausea and vomiting. Negative for abdominal distention and blood in stool.  Endocrine: Negative for polydipsia and polyphagia.  Genitourinary: Negative for dysuria, flank pain and frequency.  Musculoskeletal: Negative for arthralgias and back pain.  Skin: Negative for rash and wound.  Neurological: Negative for dizziness, syncope and numbness.  Psychiatric/Behavioral: Negative for agitation and confusion.    Physical Exam Updated Vital Signs BP (!) 138/105   Pulse 80   Temp 97.7 F (36.5 C) (Oral)   Resp 17   Ht 5\' 10"  (1.778 m)   Wt 90.7 kg   SpO2 95%   BMI 28.70 kg/m   Physical Exam Constitutional:      Comments: uncomfortable in bed  Cardiovascular:     Rate and Rhythm: Normal rate and regular rhythm.     Heart sounds: No murmur heard. No friction rub. No gallop.   Pulmonary:     Effort: Pulmonary effort is normal.     Breath sounds: Normal breath sounds. No wheezing or rales.  Abdominal:     General: Abdomen is flat. Bowel sounds are normal. There is no distension.     Palpations: Abdomen is soft.     Tenderness: There is generalized abdominal tenderness (mild on palpation). There is no  right CVA tenderness, left CVA tenderness or guarding.  Genitourinary:    Testes:        Right: Swelling not present.        Left: Swelling not present.  Skin:    General: Skin is warm and dry.     Capillary Refill: Capillary refill takes less than 2 seconds.  Neurological:     General: No focal deficit present.     Mental Status: He is alert and oriented to person, place, and time.  Psychiatric:        Mood and Affect: Mood normal.        Behavior: Behavior normal.     ED Results / Procedures / Treatments   Labs (all labs ordered are listed, but only abnormal results are displayed) Labs Reviewed   COMPREHENSIVE METABOLIC PANEL - Abnormal; Notable for the following components:      Result Value   Potassium 3.4 (*)    Glucose, Bld 226 (*)    Calcium 8.6 (*)    Total Protein 6.1 (*)    All other components within normal limits  CBC WITH DIFFERENTIAL/PLATELET - Abnormal; Notable for the following components:   WBC 11.4 (*)    Neutro Abs 9.4 (*)    Abs Immature Granulocytes 0.08 (*)    All other components within normal limits  LIPASE, BLOOD    EKG None  Radiology No results found.  Medications Ordered in ED Medications  acetaminophen (TYLENOL) tablet 650 mg (has no administration in time range)  famotidine (PEPCID) IVPB 20 mg premix (0 mg Intravenous Stopped 04/16/21 1211)  alum & mag hydroxide-simeth (MAALOX/MYLANTA) 200-200-20 MG/5ML suspension 30 mL (30 mLs Oral Given 04/16/21 1048)  ondansetron (ZOFRAN) injection 4 mg (4 mg Intravenous Given 04/16/21 1049)    ED Course  I have reviewed the triage vital signs and the nursing notes.  Pertinent labs & imaging results that were available during my care of the patient were reviewed by me and considered in my medical decision making (see chart for details).    MDM Rules/Calculators/A&P Patient present with acute epigastric pain for 1 day with associated nausea, vomiting, and diarrhea. Differential includes gastritis, pancreatitis, biliary obstruction. Improved with tylenol, maalox, and pepcid. CT abdomen unremarkable. Likely pain due to gastritis vs acute GI illness. Discussed return precautions and avoid alcohol use, agreeable to discharge home.  Final Clinical Impression(s) / ED Diagnoses Final diagnoses:  None    Rx / DC Orders ED Discharge Orders    None       Quincy Simmonds, MD 04/16/21 1437    Gerhard Munch, MD 04/16/21 [phone]

## 2021-04-16 NOTE — ED Notes (Signed)
Pt verbalizes understanding of discharge instructions. Opportunity for questions and answers were provided. Pt discharged from the ED.   ?

## 2021-04-16 NOTE — ED Triage Notes (Signed)
Pt c/o stomach cramps and burning that started around 1:30 yesterday afternoon with diarrhea. Pt had 2 episodes of vomiting today. Pt c/o nausea.

## 2021-09-11 ENCOUNTER — Encounter (HOSPITAL_COMMUNITY): Payer: Self-pay

## 2021-09-11 ENCOUNTER — Emergency Department (HOSPITAL_COMMUNITY)
Admission: EM | Admit: 2021-09-11 | Discharge: 2021-09-11 | Disposition: A | Discharge status: Home / Self Care | Payer: Self-pay | Attending: Student | Admitting: Student

## 2021-09-11 DIAGNOSIS — R079 Chest pain, unspecified: Secondary | ICD-10-CM | POA: Insufficient documentation

## 2021-09-11 DIAGNOSIS — M542 Cervicalgia: Secondary | ICD-10-CM | POA: Insufficient documentation

## 2021-09-11 DIAGNOSIS — R519 Headache, unspecified: Secondary | ICD-10-CM | POA: Insufficient documentation

## 2021-09-11 DIAGNOSIS — Z5321 Procedure and treatment not carried out due to patient leaving prior to being seen by health care provider: Secondary | ICD-10-CM | POA: Insufficient documentation

## 2021-09-11 NOTE — ED Notes (Signed)
When called back for blood work pt sts he is leaving.,pt handed EMT the labels and pt walked out

## 2021-09-11 NOTE — ED Provider Notes (Signed)
Emergency Medicine Provider Triage Evaluation Note  Joseph [name] , a 55 y.o. male  was evaluated in triage.  Pt complains of persistent headache and neck pain x2 days.  Patient states headache is located throughout his head.  No history of migraines.  No recent head injury.  Not currently any blood thinners.  Patient also endorses chest pain x2 days.   Review of Systems  Positive: CP, headache Negative: fever  Physical Exam  BP 106/73 (BP Location: Left Arm)   Pulse 83   Temp 97.7 F (36.5 C) (Oral)   Resp 17   Ht 5\' 10"  (1.778 m)   Wt 90.7 kg   SpO2 99%   BMI 28.70 kg/m  Gen:   Awake, no distress   Resp:  Normal effort  MSK:   Moves extremities without difficulty  Other:    Medical Decision Making  Medically screening exam initiated at 12:38 PM.  Appropriate orders placed.  [name] was informed that the remainder of the evaluation will be completed by another provider, this initial triage assessment does not replace that evaluation, and the importance of remaining in the ED until their evaluation is complete.  Cardiac labs   Joseph [name] 09/11/21 1239    09/13/21, MD 09/11/21 1415

## 2021-09-11 NOTE — ED Triage Notes (Signed)
Pt states migraine all over x 2 days, along with upper back pain.

## 2021-11-15 ENCOUNTER — Emergency Department (HOSPITAL_COMMUNITY)
Admission: EM | Admit: 2021-11-15 | Discharge: 2021-11-15 | Disposition: A | Discharge status: Home / Self Care | Payer: Self-pay | Attending: Emergency Medicine | Admitting: Emergency Medicine

## 2021-11-15 ENCOUNTER — Emergency Department (HOSPITAL_COMMUNITY): Payer: Self-pay

## 2021-11-15 DIAGNOSIS — Z20822 Contact with and (suspected) exposure to covid-19: Secondary | ICD-10-CM | POA: Insufficient documentation

## 2021-11-15 DIAGNOSIS — I10 Essential (primary) hypertension: Secondary | ICD-10-CM | POA: Insufficient documentation

## 2021-11-15 DIAGNOSIS — Z79899 Other long term (current) drug therapy: Secondary | ICD-10-CM | POA: Insufficient documentation

## 2021-11-15 DIAGNOSIS — F17210 Nicotine dependence, cigarettes, uncomplicated: Secondary | ICD-10-CM | POA: Insufficient documentation

## 2021-11-15 DIAGNOSIS — A084 Viral intestinal infection, unspecified: Secondary | ICD-10-CM

## 2021-11-15 DIAGNOSIS — R109 Unspecified abdominal pain: Secondary | ICD-10-CM | POA: Insufficient documentation

## 2021-11-15 DIAGNOSIS — J22 Unspecified acute lower respiratory infection: Secondary | ICD-10-CM | POA: Insufficient documentation

## 2021-11-15 LAB — LIPASE, BLOOD: Lipase: 24 U/L (ref 11–51)

## 2021-11-15 LAB — CBC WITH DIFFERENTIAL/PLATELET
Abs Immature Granulocytes: 0.07 10*3/uL (ref 0.00–0.07)
Basophils Absolute: 0.1 10*3/uL (ref 0.0–0.1)
Basophils Relative: 0 %
Eosinophils Absolute: 0.1 10*3/uL (ref 0.0–0.5)
Eosinophils Relative: 0 %
HCT: 43.5 % (ref 39.0–52.0)
Hemoglobin: 15.2 g/dL (ref 13.0–17.0)
Immature Granulocytes: 0 %
Lymphocytes Relative: 11 %
Lymphs Abs: 1.7 10*3/uL (ref 0.7–4.0)
MCH: 31.1 pg (ref 26.0–34.0)
MCHC: 34.9 g/dL (ref 30.0–36.0)
MCV: 89.1 fL (ref 80.0–100.0)
Monocytes Absolute: 1.4 10*3/uL — ABNORMAL HIGH (ref 0.1–1.0)
Monocytes Relative: 8 %
Neutro Abs: 13.2 10*3/uL — ABNORMAL HIGH (ref 1.7–7.7)
Neutrophils Relative %: 81 %
Platelets: 277 10*3/uL (ref 150–400)
RBC: 4.88 MIL/uL (ref 4.22–5.81)
RDW: 12.6 % (ref 11.5–15.5)
WBC: 16.5 10*3/uL — ABNORMAL HIGH (ref 4.0–10.5)
nRBC: 0 % (ref 0.0–0.2)

## 2021-11-15 LAB — RESP PANEL BY RT-PCR (FLU A&B, COVID) ARPGX2
Influenza A by PCR: NEGATIVE
Influenza B by PCR: NEGATIVE
SARS Coronavirus 2 by RT PCR: NEGATIVE

## 2021-11-15 LAB — URINALYSIS, ROUTINE W REFLEX MICROSCOPIC
Bilirubin Urine: NEGATIVE
Glucose, UA: NEGATIVE mg/dL
Hgb urine dipstick: NEGATIVE
Ketones, ur: NEGATIVE mg/dL
Leukocytes,Ua: NEGATIVE
Nitrite: NEGATIVE
Protein, ur: NEGATIVE mg/dL
Specific Gravity, Urine: 1.014 (ref 1.005–1.030)
pH: 5 (ref 5.0–8.0)

## 2021-11-15 LAB — COMPREHENSIVE METABOLIC PANEL
ALT: 29 U/L (ref 0–44)
AST: 28 U/L (ref 15–41)
Albumin: 4.3 g/dL (ref 3.5–5.0)
Alkaline Phosphatase: 70 U/L (ref 38–126)
Anion gap: 11 (ref 5–15)
BUN: 13 mg/dL (ref 6–20)
CO2: 27 mmol/L (ref 22–32)
Calcium: 9.3 mg/dL (ref 8.9–10.3)
Chloride: 100 mmol/L (ref 98–111)
Creatinine, Ser: 1.15 mg/dL (ref 0.61–1.24)
GFR, Estimated: >60 mL/min (ref >60)
Glucose, Bld: 145 mg/dL — ABNORMAL HIGH (ref 70–99)
Potassium: 3.7 mmol/L (ref 3.5–5.1)
Sodium: 138 mmol/L (ref 135–145)
Total Bilirubin: 0.9 mg/dL (ref 0.3–1.2)
Total Protein: 7.4 g/dL (ref 6.5–8.1)

## 2021-11-15 MED ORDER — ALBUTEROL SULFATE HFA 108 (90 BASE) MCG/ACT IN AERS
1.0000 | INHALATION_SPRAY | Freq: Once | RESPIRATORY_TRACT | Status: AC
  Administered 2021-11-15: 2 via RESPIRATORY_TRACT
  Filled 2021-11-15: qty 6.7

## 2021-11-15 MED ORDER — DOXYCYCLINE HYCLATE 100 MG PO CAPS: 100.0000 mg | ORAL_CAPSULE | Freq: Two times a day (BID) | ORAL | 0 refills | Status: DC

## 2021-11-15 MED ORDER — IPRATROPIUM-ALBUTEROL 0.5-2.5 (3) MG/3ML IN SOLN
3.0000 mL | Freq: Once | RESPIRATORY_TRACT | Status: AC
  Administered 2021-11-15: 3 mL via RESPIRATORY_TRACT
  Filled 2021-11-15: qty 3

## 2021-11-15 MED ORDER — ONDANSETRON HCL 4 MG PO TABS: 4.0000 mg | ORAL_TABLET | Freq: Four times a day (QID) | ORAL | 0 refills | Status: DC

## 2021-11-15 NOTE — ED Triage Notes (Signed)
Pt. Stated, I started having SOB with stomach pain and some chest pain when I cough and it started 2 days ago.

## 2021-11-15 NOTE — ED Provider Notes (Signed)
[facility] EMERGENCY DEPARTMENT Provider Note   CSN: [id number] Arrival date & time: 11/15/21  3557     History Chief Complaint  Patient presents with   Abdominal Pain   Shortness of Breath    Joseph [name] is a 55 y.o. male presents to the emergency department with 2 days of generalized abdominal pain, vomiting, cough, chest pain with cough, body aches, rhinorrhea, and nasal congestion.  Patient reports he had 2 episodes of vomiting, but is able to eat and drink since then.  Denies any sore throat, dark or tarry stools, diarrhea, constipation, current nausea, urinary complaints, or fever/chills.  The patient reports he has had several of his coworkers with some GI viral illness at work and he reports that he thinks he may have gotten it from them.  No medications trialed prior to arrival.  No exacerbating or relieving factors.  Patient reports he has medical history of blood pressure but is not on the medication he takes.  Denies any pertinent surgical history.  No daily medications.  Allergic to codeine.  Daily smoker.   Abdominal Pain Associated symptoms: cough, shortness of breath and vomiting   Associated symptoms: no chest pain, no chills, no constipation, no diarrhea, no dysuria, no fever, no hematuria, no nausea and no sore throat   Shortness of Breath Associated symptoms: abdominal pain, cough and vomiting   Associated symptoms: no chest pain, no ear pain, no fever, no rash and no sore throat       Past Medical History:  Diagnosis Date   Hypertension     There are no problems to display for this patient.   No past surgical history on file.     Family History  Problem Relation Age of Onset   Hypertension Father     Social History   Tobacco Use   Smoking status: Every Day    Packs/day: 0.10    Years: 25.00    Pack years: 2.50    Types: Cigarettes   Smokeless tobacco: Never  Vaping Use   Vaping Use: Never used  Substance Use  Topics   Alcohol use: Yes    Comment: 6 pack/week   Drug use: Yes    Types: Marijuana, Cocaine    Home Medications Prior to Admission medications   Medication Sig Start Date End Date Taking? Authorizing Provider  famotidine (PEPCID) 20 MG tablet Take 1 tablet (20 mg total) by mouth 2 (two) times daily for 7 days. 04/16/21 04/23/21  Quincy Simmonds, MD  lisinopril (ZESTRIL) 10 MG tablet Take 1 tablet (10 mg total) by mouth daily. Patient not taking: Reported on 04/16/2021 08/04/20   Placido Sou, PA-C    Allergies    Codeine  Review of Systems   Review of Systems  Constitutional:  Negative for chills and fever.  HENT:  Positive for congestion and rhinorrhea. Negative for ear pain and sore throat.   Eyes:  Negative for pain and visual disturbance.  Respiratory:  Positive for cough and shortness of breath.   Cardiovascular:  Negative for chest pain and palpitations.  Gastrointestinal:  Positive for abdominal pain and vomiting. Negative for blood in stool, constipation, diarrhea and nausea.  Genitourinary:  Negative for dysuria and hematuria.  Musculoskeletal:  Positive for myalgias. Negative for arthralgias and back pain.  Skin:  Negative for color change and rash.  Neurological:  Negative for seizures and syncope.  All other systems reviewed and are negative.  Physical Exam Updated Vital Signs BP 122/78  Pulse 85    Temp 98 F (36.7 C) (Oral)    Resp 17    SpO2 93%   Physical Exam Vitals and nursing note reviewed.  Constitutional:      General: He is not in acute distress.    Appearance: He is not toxic-appearing.  HENT:     Head: Normocephalic and atraumatic.     Right Ear: Tympanic membrane, ear canal and external ear normal.     Left Ear: Tympanic membrane, ear canal and external ear normal.     Nose:     Comments: Bilateral nasal turbinate edema and erythema with scant clear nasal discharge.    Mouth/Throat:     Mouth: Mucous membranes are moist.     Comments: No  pharyngeal erythema, exudate, or edema noted.  Uvula midline.  Airway patent.  Moist mucous membranes. Eyes:     General: No scleral icterus.    Conjunctiva/sclera: Conjunctivae normal.  Cardiovascular:     Rate and Rhythm: Normal rate.  Pulmonary:     Effort: Pulmonary effort is normal. No respiratory distress.     Comments: Expiratory wheezing heard throughout with some rhonchi in bilateral bases.  Patient satting 96% on room air.  No respiratory distress, accessory muscle use, tripoding, nasal flaring, or cyanosis present.  Patient speaking in full sentences with ease. Abdominal:     General: Abdomen is flat.     Palpations: Abdomen is soft.     Tenderness: There is no abdominal tenderness.     Comments: No abdominal tenderness or overlying skin changes noted.  Normal active bowel sounds.  Musculoskeletal:        General: No deformity.     Cervical back: Normal range of motion.  Skin:    Findings: No rash.  Neurological:     General: No focal deficit present.     Mental Status: He is alert.    ED Results / Procedures / Treatments   Labs (all labs ordered are listed, but only abnormal results are displayed) Labs Reviewed  COMPREHENSIVE METABOLIC PANEL - Abnormal; Notable for the following components:      Result Value   Glucose, Bld 145 (*)    All other components within normal limits  CBC WITH DIFFERENTIAL/PLATELET - Abnormal; Notable for the following components:   WBC 16.5 (*)    Neutro Abs 13.2 (*)    Monocytes Absolute 1.4 (*)    All other components within normal limits  RESP PANEL BY RT-PCR (FLU A&B, COVID) ARPGX2  LIPASE, BLOOD  URINALYSIS, ROUTINE W REFLEX MICROSCOPIC    EKG EKG Interpretation  Date/Time:  Saturday November 15 2021 10:02:22 EST Ventricular Rate:  114 PR Interval:  118 QRS Duration: 88 QT Interval:  334 QTC Calculation: 460 R Axis:   57 Text Interpretation: Sinus tachycardia Left ventricular hypertrophy Nonspecific ST abnormality  Confirmed by Lajean Saver [phone]) on 11/15/2021 1:13:50 PM  Radiology DG Chest 2 View  Result Date: 11/15/2021 CLINICAL DATA:  55 year old male with history of cough and shortness of breath with congestion, sweats, chills and fevers for the past 2 days. EXAM: CHEST - 2 VIEW COMPARISON:  Chest x-ray 08/04/2020. FINDINGS: Lung volumes are normal. No consolidative airspace disease. No pleural effusions. No pneumothorax. No pulmonary nodule or mass noted. Pulmonary vasculature and the cardiomediastinal silhouette are within normal limits. Atherosclerosis in the thoracic aorta. IMPRESSION: 1.  No radiographic evidence of acute cardiopulmonary disease. 2. Aortic atherosclerosis. Electronically Signed   By: Mauri Brooklyn.D.  On: 11/15/2021 11:07    Procedures Procedures   Medications Ordered in ED Medications  albuterol (VENTOLIN HFA) 108 (90 Base) MCG/ACT inhaler 1-2 puff (2 puffs Inhalation Given 11/15/21 1356)  ipratropium-albuterol (DUONEB) 0.5-2.5 (3) MG/3ML nebulizer solution 3 mL (3 mLs Nebulization Given 11/15/21 1356)    ED Course  I have reviewed the triage vital signs and the nursing notes.  Pertinent labs & imaging results that were available during my care of the patient were reviewed by me and considered in my medical decision making (see chart for details).  55 year old male presents to the emergency department with URI symptoms plus GI symptoms.  Differential diagnosis includes was not limited to viral illness, COVID, flu, gastroenteritis, pneumonia, URI, pneumothorax, pancreatitis, UTI.  Vital signs show the patient that is normotensive, afebrile, normal pulse rate, satting well on room air.  No acute distress.  Physical exam, he has expiratory wheezing throughout with rhonchi in bilateral bases.  The patient is not in any respiratory distress and is speaking in full sentences with ease.  Labs and imaging ordered.  CBC shows elevated white blood cell count of 16.5 with a left  shift.  No signs of anemia.  CMP shows mildly elevated glucose at 145, however no electrolyte abnormalities.  Liver enzymes normal.  Urinalysis negative.  Respiratory panel negative for flu and COVID.  Normal lipase.  Chest x-ray shows no acute cardiopulmonary process.  No pneumonia or pneumothorax visualized.  Low suspicion for pancreatitis or UTI based on normal labs.  Will order breathing treatment for the patient.  On reevaluation after the breathing treatment, the patient's wheezing improved significantly.  The patient reports he is feeling much better.  We will send home with albuterol inhaler to take home.  Although the chest x-ray shows no acute cardiopulmonary process.  I insufficiency this patient may have a respiratory infection/pneumonia.  Will prescribe patient doxycycline to cover for pneumonia.  I advised patient to follow-up with his PCP for reevaluation at a later time.  I advised patient to stop smoking.  Additionally, I prescribed the patient Zofran just in case his nausea came back.  Strict return precautions discussed.  Patient agrees to plan.  Patient is stable and being discharged home in good condition.  I discussed this case with my attending physician who cosigned this note including patient's presenting symptoms, physical exam, and planned diagnostics and interventions. Attending physician stated agreement with plan or made changes to plan which were implemented.     MDM Rules/Calculators/A&P                           Final Clinical Impression(s) / ED Diagnoses Final diagnoses:  Lower respiratory infection    Rx / DC Orders ED Discharge Orders          Ordered    doxycycline (VIBRAMYCIN) 100 MG capsule  2 times daily        11/15/21 1443    ondansetron (ZOFRAN) 4 MG tablet  Every 6 hours        11/15/21 1444             Sherrell Puller, [place] 11/18/21 2239    Lajean Saver, MD 11/19/21 1213

## 2021-11-15 NOTE — ED Provider Notes (Signed)
Emergency Medicine Provider Triage Evaluation Note  Joseph [name] , a 55 y.o. male  was evaluated in triage.  Pt complains of cough, shortness of breath, diarrhea and abdominal pain.  Symptoms have been present for the past 2 days.  Patient reports he woke up yesterday feeling poorly with body aches, cough, congestion and shortness of breath.  Reports he has had multiple episodes of nonbloody diarrhea and did have some nonbloody emesis as well.  Reports mild generalized abdominal pain.  Unsure of sick contacts but reports multiple people have been out at work  Review of Systems  Positive: Shortness of breath, cough, abdominal pain, vomiting, diarrhea Negative: Fevers, chest pain  Physical Exam  BP (!) 175/105 (BP Location: Right Arm)    Pulse (!) 123    Temp 98 F (36.7 C) (Oral)    Resp 17    SpO2 100%  Gen:   Awake, no distress   Resp:  Normal effort, frequent cough, rhonchi in bilateral lung fields Cardiac: Tachycardia with regular rhythm Abd:  Mild generalized abdominal tenderness, no guarding or peritoneal signs MSK:   Moves extremities without difficulty    Medical Decision Making  Medically screening exam initiated at 10:10 AM.  Appropriate orders placed.  [name] was informed that the remainder of the evaluation will be completed by another provider, this initial triage assessment does not replace that evaluation, and the importance of remaining in the ED until their evaluation is complete.  Patient with cough, congestion, shortness of breath, diarrhea and abdominal pain, concern for potential COVID or flu, he has rhonchi on lung exam.  Will get labs, COVID and flu testing and chest x-ray   Legrand Rams 11/15/21 1012    Cathren Laine, MD 11/15/21 1318

## 2021-11-15 NOTE — Discharge Instructions (Addendum)
Seen here today for evaluation of your cough and cold symptoms.  You tested negative for COVID and flu.  Due to your recent sick contacts at work, this is likely a viral gastroenteritis infection.  Because of your elevated white count and sounds, I will place you on doxycycline, an antibiotic for pneumonia.  You have been given an albuterol inhaler to take as needed for cough.  If you have any concern, new or worsening symptoms, please return to the nearest emergency department for evaluation.

## 2021-11-21 ENCOUNTER — Encounter (HOSPITAL_COMMUNITY): Payer: Self-pay | Admitting: General Surgery

## 2021-11-21 ENCOUNTER — Emergency Department (HOSPITAL_COMMUNITY): Payer: Self-pay | Admitting: Anesthesiology

## 2021-11-21 ENCOUNTER — Inpatient Hospital Stay (HOSPITAL_COMMUNITY): Payer: Self-pay

## 2021-11-21 ENCOUNTER — Emergency Department (HOSPITAL_COMMUNITY): Payer: Self-pay

## 2021-11-21 ENCOUNTER — Inpatient Hospital Stay (HOSPITAL_COMMUNITY)
Admission: EM | Admit: 2021-11-21 | Discharge: 2021-12-03 | DRG: 157 | Disposition: A | Discharge status: Home / Self Care | Payer: Self-pay | Attending: General Surgery | Admitting: General Surgery

## 2021-11-21 DIAGNOSIS — S0240DA Maxillary fracture, left side, initial encounter for closed fracture: Secondary | ICD-10-CM | POA: Diagnosis present

## 2021-11-21 DIAGNOSIS — Z781 Physical restraint status: Secondary | ICD-10-CM

## 2021-11-21 DIAGNOSIS — T07XXXA Unspecified multiple injuries, initial encounter: Secondary | ICD-10-CM

## 2021-11-21 DIAGNOSIS — T17908A Unspecified foreign body in respiratory tract, part unspecified causing other injury, initial encounter: Secondary | ICD-10-CM

## 2021-11-21 DIAGNOSIS — Z20822 Contact with and (suspected) exposure to covid-19: Secondary | ICD-10-CM | POA: Diagnosis present

## 2021-11-21 DIAGNOSIS — Z5901 Sheltered homelessness: Secondary | ICD-10-CM

## 2021-11-21 DIAGNOSIS — T884XXA Failed or difficult intubation, initial encounter: Secondary | ICD-10-CM

## 2021-11-21 DIAGNOSIS — F10139 Alcohol abuse with withdrawal, unspecified: Secondary | ICD-10-CM | POA: Diagnosis present

## 2021-11-21 DIAGNOSIS — E870 Hyperosmolality and hypernatremia: Secondary | ICD-10-CM | POA: Diagnosis present

## 2021-11-21 DIAGNOSIS — S02651B Fracture of angle of right mandible, initial encounter for open fracture: Principal | ICD-10-CM | POA: Diagnosis present

## 2021-11-21 DIAGNOSIS — Z8249 Family history of ischemic heart disease and other diseases of the circulatory system: Secondary | ICD-10-CM

## 2021-11-21 DIAGNOSIS — S0993XA Unspecified injury of face, initial encounter: Secondary | ICD-10-CM

## 2021-11-21 DIAGNOSIS — I10 Essential (primary) hypertension: Secondary | ICD-10-CM | POA: Diagnosis present

## 2021-11-21 DIAGNOSIS — R2689 Other abnormalities of gait and mobility: Secondary | ICD-10-CM

## 2021-11-21 DIAGNOSIS — K029 Dental caries, unspecified: Secondary | ICD-10-CM | POA: Diagnosis present

## 2021-11-21 DIAGNOSIS — E876 Hypokalemia: Secondary | ICD-10-CM | POA: Diagnosis present

## 2021-11-21 DIAGNOSIS — Z0189 Encounter for other specified special examinations: Secondary | ICD-10-CM

## 2021-11-21 DIAGNOSIS — J9601 Acute respiratory failure with hypoxia: Secondary | ICD-10-CM | POA: Diagnosis present

## 2021-11-21 DIAGNOSIS — F1490 Cocaine use, unspecified, uncomplicated: Secondary | ICD-10-CM | POA: Diagnosis present

## 2021-11-21 DIAGNOSIS — S022XXA Fracture of nasal bones, initial encounter for closed fracture: Secondary | ICD-10-CM | POA: Diagnosis present

## 2021-11-21 DIAGNOSIS — S02601B Fracture of unspecified part of body of right mandible, initial encounter for open fracture: Secondary | ICD-10-CM

## 2021-11-21 DIAGNOSIS — Z4659 Encounter for fitting and adjustment of other gastrointestinal appliance and device: Secondary | ICD-10-CM

## 2021-11-21 DIAGNOSIS — S02652A Fracture of angle of left mandible, initial encounter for closed fracture: Secondary | ICD-10-CM | POA: Diagnosis present

## 2021-11-21 DIAGNOSIS — S0219XA Other fracture of base of skull, initial encounter for closed fracture: Secondary | ICD-10-CM | POA: Diagnosis present

## 2021-11-21 DIAGNOSIS — Z978 Presence of other specified devices: Secondary | ICD-10-CM

## 2021-11-21 DIAGNOSIS — Z23 Encounter for immunization: Secondary | ICD-10-CM

## 2021-11-21 LAB — CBC
HCT: 46.8 % (ref 39.0–52.0)
Hemoglobin: 15.4 g/dL (ref 13.0–17.0)
MCH: 30.1 pg (ref 26.0–34.0)
MCHC: 32.9 g/dL (ref 30.0–36.0)
MCV: 91.4 fL (ref 80.0–100.0)
Platelets: 260 10*3/uL (ref 150–400)
RBC: 5.12 MIL/uL (ref 4.22–5.81)
RDW: 12.5 % (ref 11.5–15.5)
WBC: 17.5 10*3/uL — ABNORMAL HIGH (ref 4.0–10.5)
nRBC: 0 % (ref 0.0–0.2)

## 2021-11-21 LAB — COMPREHENSIVE METABOLIC PANEL
ALT: 25 U/L (ref 0–44)
AST: 31 U/L (ref 15–41)
Albumin: 4.3 g/dL (ref 3.5–5.0)
Alkaline Phosphatase: 65 U/L (ref 38–126)
Anion gap: 12 (ref 5–15)
BUN: 12 mg/dL (ref 6–20)
CO2: 24 mmol/L (ref 22–32)
Calcium: 9 mg/dL (ref 8.9–10.3)
Chloride: 101 mmol/L (ref 98–111)
Creatinine, Ser: 1.14 mg/dL (ref 0.61–1.24)
GFR, Estimated: >60 mL/min (ref >60)
Glucose, Bld: 144 mg/dL — ABNORMAL HIGH (ref 70–99)
Potassium: 3.8 mmol/L (ref 3.5–5.1)
Sodium: 137 mmol/L (ref 135–145)
Total Bilirubin: 0.5 mg/dL (ref 0.3–1.2)
Total Protein: 7.1 g/dL (ref 6.5–8.1)

## 2021-11-21 LAB — PROTIME-INR
INR: 0.9 (ref 0.8–1.2)
Prothrombin Time: 12.6 seconds (ref 11.4–15.2)

## 2021-11-21 LAB — I-STAT CHEM 8, ED
BUN: 15 mg/dL (ref 6–20)
Calcium, Ion: 1.06 mmol/L — ABNORMAL LOW (ref 1.15–1.40)
Chloride: 100 mmol/L (ref 98–111)
Creatinine, Ser: 1.2 mg/dL (ref 0.61–1.24)
Glucose, Bld: 137 mg/dL — ABNORMAL HIGH (ref 70–99)
HCT: 50 % (ref 39.0–52.0)
Hemoglobin: 17 g/dL (ref 13.0–17.0)
Potassium: 4 mmol/L (ref 3.5–5.1)
Sodium: 137 mmol/L (ref 135–145)
TCO2: 26 mmol/L (ref 22–32)

## 2021-11-21 LAB — SAMPLE TO BLOOD BANK

## 2021-11-21 LAB — RESP PANEL BY RT-PCR (FLU A&B, COVID) ARPGX2
Influenza A by PCR: NEGATIVE
Influenza B by PCR: NEGATIVE
SARS Coronavirus 2 by RT PCR: NEGATIVE

## 2021-11-21 LAB — ETHANOL: Alcohol, Ethyl (B): 137 mg/dL — ABNORMAL HIGH (ref <10)

## 2021-11-21 MED ORDER — PROPOFOL 1000 MG/100ML IV EMUL
5.0000 ug/kg/min | INTRAVENOUS | Status: DC
  Administered 2021-11-21: 15 ug/kg/min via INTRAVENOUS

## 2021-11-21 MED ORDER — FENTANYL BOLUS VIA INFUSION
50.0000 ug | INTRAVENOUS | Status: DC | PRN
  Filled 2021-11-21: qty 100

## 2021-11-21 MED ORDER — CLINDAMYCIN PHOSPHATE 900 MG/50ML IV SOLN
900.0000 mg | Freq: Once | INTRAVENOUS | Status: AC
  Administered 2021-11-21: 900 mg via INTRAVENOUS

## 2021-11-21 MED ORDER — PROPOFOL 1000 MG/100ML IV EMUL
5.0000 ug/kg/min | INTRAVENOUS | Status: DC
  Administered 2021-11-21: 30 ug/kg/min via INTRAVENOUS

## 2021-11-21 MED ORDER — MIDAZOLAM HCL 2 MG/2ML IJ SOLN
INTRAMUSCULAR | Status: AC
  Administered 2021-11-21: 4 mg
  Filled 2021-11-21: qty 4

## 2021-11-21 MED ORDER — FENTANYL CITRATE PF 50 MCG/ML IJ SOSY
50.0000 ug | PREFILLED_SYRINGE | Freq: Once | INTRAMUSCULAR | Status: AC
  Administered 2021-11-21: 100 ug via INTRAVENOUS

## 2021-11-21 MED ORDER — SUCCINYLCHOLINE CHLORIDE 200 MG/10ML IV SOSY
PREFILLED_SYRINGE | INTRAVENOUS | Status: DC | PRN
Start: 2021-11-21 — End: 2021-11-21

## 2021-11-21 MED ORDER — FENTANYL 2500MCG IN NS 250ML (10MCG/ML) PREMIX INFUSION
50.0000 ug/h | INTRAVENOUS | Status: DC
  Administered 2021-11-21: 150 ug/h via INTRAVENOUS
  Filled 2021-11-21: qty 250

## 2021-11-21 MED ORDER — SUCCINYLCHOLINE CHLORIDE 20 MG/ML IJ SOLN
INTRAMUSCULAR | Status: AC | PRN
  Administered 2021-11-21: 150 mg via INTRAVENOUS

## 2021-11-21 MED ORDER — MIDAZOLAM HCL 2 MG/2ML IJ SOLN
2.0000 mg | INTRAMUSCULAR | Status: DC | PRN
  Administered 2021-11-23 – 2021-11-24 (×2): 2 mg via INTRAVENOUS

## 2021-11-21 MED ORDER — MIDAZOLAM-SODIUM CHLORIDE 100-0.9 MG/100ML-% IV SOLN
0.5000 mg/h | INTRAVENOUS | Status: DC
  Administered 2021-11-21: 1 mg/h via INTRAVENOUS
  Administered 2021-11-21 (×2): 2 mg/h via INTRAVENOUS
  Filled 2021-11-21: qty 100

## 2021-11-21 MED ORDER — ETOMIDATE 2 MG/ML IV SOLN
INTRAVENOUS | Status: AC | PRN
  Administered 2021-11-21: 25 mg via INTRAVENOUS

## 2021-11-21 MED ORDER — MIDAZOLAM HCL 2 MG/2ML IJ SOLN
2.0000 mg | INTRAMUSCULAR | Status: DC | PRN
  Administered 2021-11-22 (×2): 2 mg via INTRAVENOUS

## 2021-11-21 NOTE — ED Notes (Signed)
 propofol bolus [facility]BO EDP Ray

## 2021-11-21 NOTE — ED Notes (Addendum)
[place] collar placed by [employer]
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
## 2021-11-21 NOTE — ED Notes (Signed)
 bolus prop

## 2021-11-21 NOTE — Progress Notes (Signed)
Oral and ETT suctioning done
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
## 2021-11-21 NOTE — Progress Notes (Signed)
Orthopedic Tech Progress Note Patient Details:  Joseph [name] 07-21-1966 [id number]  Patient ID: [name], male   DOB: [birth year], 56 y.o.   MRN: [id number]  Kizzie Fantasia 11/21/2021, 10:33 PM trauma

## 2021-11-21 NOTE — ED Notes (Signed)
Returned from CT.

## 2021-11-21 NOTE — ED Notes (Signed)
To CT w TRN, RT, NT & primary RN
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
## 2021-11-21 NOTE — Anesthesia Procedure Notes (Signed)
Procedure Name: Intubation Date/Time: 11/21/2021 10:13 PM Performed by: Valetta Fuller, CRNA Pre-anesthesia Checklist: Patient identified, Emergency Drugs available, Suction available and Patient being monitored Patient Re-evaluated:Patient Re-evaluated prior to induction Oxygen Delivery Method: Ambu bag Preoxygenation: Pre-oxygenation with 100% oxygen Induction Type: IV induction, Rapid sequence and Cricoid Pressure applied Laryngoscope Size: Glidescope Grade View: Grade I Tube type: Oral Tube size: 8.0 mm Number of attempts: 1 Airway Equipment and Method: Stylet Placement Confirmation: ETT inserted through vocal cords under direct vision, positive ETCO2 and breath sounds checked- equal and bilateral Secured at: 24 cm Tube secured with: Tape Dental Injury: Teeth and Oropharynx as per pre-operative assessment

## 2021-11-22 ENCOUNTER — Inpatient Hospital Stay (HOSPITAL_COMMUNITY): Payer: Self-pay

## 2021-11-22 ENCOUNTER — Inpatient Hospital Stay (HOSPITAL_COMMUNITY): Payer: Self-pay | Admitting: Anesthesiology

## 2021-11-22 ENCOUNTER — Encounter (HOSPITAL_COMMUNITY): Admission: EM | Disposition: A | Discharge status: Home / Self Care | Payer: Self-pay | Source: Home / Self Care

## 2021-11-22 ENCOUNTER — Encounter (HOSPITAL_COMMUNITY): Payer: Self-pay

## 2021-11-22 HISTORY — PX: ORIF MANDIBULAR FRACTURE: SHX2127

## 2021-11-22 HISTORY — PX: FACIAL LACERATION REPAIR: SHX6589

## 2021-11-22 LAB — MRSA NEXT GEN BY PCR, NASAL: MRSA by PCR Next Gen: DETECTED — AB

## 2021-11-22 LAB — BASIC METABOLIC PANEL
Anion gap: 10 (ref 5–15)
BUN: 11 mg/dL (ref 6–20)
CO2: 21 mmol/L — ABNORMAL LOW (ref 22–32)
Calcium: 8.6 mg/dL — ABNORMAL LOW (ref 8.9–10.3)
Chloride: 103 mmol/L (ref 98–111)
Creatinine, Ser: 0.93 mg/dL (ref 0.61–1.24)
GFR, Estimated: >60 mL/min (ref >60)
Glucose, Bld: 123 mg/dL — ABNORMAL HIGH (ref 70–99)
Potassium: 3.6 mmol/L (ref 3.5–5.1)
Sodium: 134 mmol/L — ABNORMAL LOW (ref 135–145)

## 2021-11-22 LAB — URINALYSIS, ROUTINE W REFLEX MICROSCOPIC
Bilirubin Urine: NEGATIVE
Glucose, UA: NEGATIVE mg/dL
Ketones, ur: NEGATIVE mg/dL
Leukocytes,Ua: NEGATIVE
Nitrite: NEGATIVE
Protein, ur: 30 mg/dL — AB
Specific Gravity, Urine: 1.025 (ref 1.005–1.030)
pH: 5.5 (ref 5.0–8.0)

## 2021-11-22 LAB — POCT I-STAT 7, (LYTES, BLD GAS, ICA,H+H)
Acid-base deficit: 2 mmol/L (ref 0.0–2.0)
Acid-base deficit: 2 mmol/L (ref 0.0–2.0)
Bicarbonate: 22.8 mmol/L (ref 20.0–28.0)
Bicarbonate: 27.6 mmol/L (ref 20.0–28.0)
Calcium, Ion: 1.17 mmol/L (ref 1.15–1.40)
Calcium, Ion: 1.2 mmol/L (ref 1.15–1.40)
HCT: 44 % (ref 39.0–52.0)
HCT: 44 % (ref 39.0–52.0)
Hemoglobin: 15 g/dL (ref 13.0–17.0)
Hemoglobin: 15 g/dL (ref 13.0–17.0)
O2 Saturation: 100 %
O2 Saturation: 99 %
Patient temperature: 99.7
Patient temperature: 96.6
Potassium: 3.5 mmol/L (ref 3.5–5.1)
Potassium: 4 mmol/L (ref 3.5–5.1)
Sodium: 139 mmol/L (ref 135–145)
Sodium: 140 mmol/L (ref 135–145)
TCO2: 24 mmol/L (ref 22–32)
TCO2: 30 mmol/L (ref 22–32)
pCO2 arterial: 40.8 mmHg (ref 32.0–48.0)
pCO2 arterial: 64.7 mmHg — ABNORMAL HIGH (ref 32.0–48.0)
pH, Arterial: 7.358 (ref 7.350–7.450)
pH, Arterial: 7.232 — ABNORMAL LOW (ref 7.350–7.450)
pO2, Arterial: 241 mmHg — ABNORMAL HIGH (ref 83.0–108.0)
pO2, Arterial: 178 mmHg — ABNORMAL HIGH (ref 83.0–108.0)

## 2021-11-22 LAB — LACTIC ACID, PLASMA: Lactic Acid, Venous: 2.5 mmol/L (ref 0.5–1.9)

## 2021-11-22 LAB — URINALYSIS, MICROSCOPIC (REFLEX)

## 2021-11-22 LAB — CBC
HCT: 42.6 % (ref 39.0–52.0)
Hemoglobin: 15 g/dL (ref 13.0–17.0)
MCH: 31.2 pg (ref 26.0–34.0)
MCHC: 35.2 g/dL (ref 30.0–36.0)
MCV: 88.6 fL (ref 80.0–100.0)
Platelets: 265 10*3/uL (ref 150–400)
RBC: 4.81 MIL/uL (ref 4.22–5.81)
RDW: 12.5 % (ref 11.5–15.5)
WBC: 21.7 10*3/uL — ABNORMAL HIGH (ref 4.0–10.5)
nRBC: 0 % (ref 0.0–0.2)

## 2021-11-22 LAB — RAPID URINE DRUG SCREEN, HOSP PERFORMED
Amphetamines: NOT DETECTED
Barbiturates: NOT DETECTED
Benzodiazepines: POSITIVE — AB
Cocaine: POSITIVE — AB
Opiates: NOT DETECTED
Tetrahydrocannabinol: NOT DETECTED

## 2021-11-22 LAB — HIV ANTIBODY (ROUTINE TESTING W REFLEX): HIV Screen 4th Generation wRfx: NONREACTIVE

## 2021-11-22 LAB — TRIGLYCERIDES: Triglycerides: 197 mg/dL — ABNORMAL HIGH (ref <150)

## 2021-11-22 SURGERY — OPEN REDUCTION INTERNAL FIXATION (ORIF) MANDIBULAR FRACTURE
Anesthesia: General | Site: Mouth

## 2021-11-22 MED ORDER — FENTANYL 2500MCG IN NS 250ML (10MCG/ML) PREMIX INFUSION
0.0000 ug/h | INTRAVENOUS | Status: DC
  Administered 2021-11-22: 200 ug/h via INTRAVENOUS
  Administered 2021-11-22: 250 ug/h via INTRAVENOUS
  Administered 2021-11-22: 350 ug/h via INTRAVENOUS
  Administered 2021-11-23 (×2): 250 ug/h via INTRAVENOUS
  Administered 2021-11-23: 200 ug/h via INTRAVENOUS
  Administered 2021-11-24: 250 ug/h via INTRAVENOUS
  Filled 2021-11-22 (×6): qty 250

## 2021-11-22 MED ORDER — FENTANYL CITRATE PF 50 MCG/ML IJ SOSY: 50.0000 ug | PREFILLED_SYRINGE | Freq: Once | INTRAMUSCULAR | Status: DC

## 2021-11-22 MED ORDER — CEFAZOLIN SODIUM 1 G IJ SOLR
INTRAMUSCULAR | Status: AC
  Filled 2021-11-22: qty 20

## 2021-11-22 MED ORDER — PANTOPRAZOLE SODIUM 40 MG IV SOLR
40.0000 mg | INTRAVENOUS | Status: DC
  Administered 2021-11-23 – 2021-11-25 (×3): 40 mg via INTRAVENOUS
  Filled 2021-11-22 (×3): qty 40

## 2021-11-22 MED ORDER — CEFAZOLIN SODIUM-DEXTROSE 1-4 GM/50ML-% IV SOLN
1.0000 g | Freq: Three times a day (TID) | INTRAVENOUS | Status: AC
  Administered 2021-11-22 – 2021-11-25 (×9): 1 g via INTRAVENOUS
  Filled 2021-11-22 (×9): qty 50

## 2021-11-22 MED ORDER — OXYMETAZOLINE HCL 0.05 % NA SOLN
NASAL | Status: AC
  Filled 2021-11-22: qty 30

## 2021-11-22 MED ORDER — 0.9 % SODIUM CHLORIDE (POUR BTL) OPTIME
TOPICAL | Status: DC | PRN
  Administered 2021-11-22: 1000 mL

## 2021-11-22 MED ORDER — BACITRACIN-NEOMYCIN-POLYMYXIN 400-5-5000 EX OINT
TOPICAL_OINTMENT | CUTANEOUS | Status: DC | PRN
Start: 2021-11-22 — End: 2021-11-22
  Administered 2021-11-22: 1 via TOPICAL

## 2021-11-22 MED ORDER — ONDANSETRON HCL 4 MG/2ML IJ SOLN
INTRAMUSCULAR | Status: DC | PRN
  Administered 2021-11-22: 4 mg via INTRAVENOUS

## 2021-11-22 MED ORDER — POLYETHYLENE GLYCOL 3350 17 G PO PACK
17.0000 g | PACK | Freq: Every day | ORAL | Status: DC
  Administered 2021-11-23 – 2021-11-24 (×2): 17 g
  Filled 2021-11-22 (×2): qty 1

## 2021-11-22 MED ORDER — ONDANSETRON HCL 4 MG/2ML IJ SOLN
INTRAMUSCULAR | Status: AC
  Filled 2021-11-22: qty 2

## 2021-11-22 MED ORDER — BISACODYL 10 MG RE SUPP: 10.0000 mg | Freq: Every day | RECTAL | Status: DC | PRN

## 2021-11-22 MED ORDER — BACITRACIN ZINC 500 UNIT/GM EX OINT
TOPICAL_OINTMENT | CUTANEOUS | Status: AC
  Filled 2021-11-22: qty 28.35

## 2021-11-22 MED ORDER — DEXAMETHASONE SODIUM PHOSPHATE 10 MG/ML IJ SOLN
INTRAMUSCULAR | Status: AC
  Filled 2021-11-22: qty 1

## 2021-11-22 MED ORDER — LIDOCAINE 2% (20 MG/ML) 5 ML SYRINGE
INTRAMUSCULAR | Status: AC
  Filled 2021-11-22: qty 5

## 2021-11-22 MED ORDER — ENOXAPARIN SODIUM 30 MG/0.3ML IJ SOSY
30.0000 mg | PREFILLED_SYRINGE | Freq: Two times a day (BID) | INTRAMUSCULAR | Status: DC
  Administered 2021-11-22 – 2021-12-03 (×23): 30 mg via SUBCUTANEOUS
  Filled 2021-11-22 (×24): qty 0.3

## 2021-11-22 MED ORDER — FENTANYL CITRATE (PF) 250 MCG/5ML IJ SOLN
INTRAMUSCULAR | Status: DC | PRN
  Administered 2021-11-22: 150 ug via INTRAVENOUS
  Administered 2021-11-22 (×2): 50 ug via INTRAVENOUS

## 2021-11-22 MED ORDER — SODIUM CHLORIDE 0.9 % IV SOLN
1.0000 mg | Freq: Once | INTRAVENOUS | Status: AC
  Administered 2021-11-22: 1 mg via INTRAVENOUS
  Filled 2021-11-22: qty 0.2

## 2021-11-22 MED ORDER — BACITRACIN-NEOMYCIN-POLYMYXIN OINTMENT TUBE
TOPICAL_OINTMENT | CUTANEOUS | Status: AC
  Filled 2021-11-22: qty 14.17

## 2021-11-22 MED ORDER — MIDAZOLAM-SODIUM CHLORIDE 100-0.9 MG/100ML-% IV SOLN
0.5000 mg/h | INTRAVENOUS | Status: DC
  Administered 2021-11-22: 6 mg/h via INTRAVENOUS
  Administered 2021-11-22: 4 mg/h via INTRAVENOUS
  Administered 2021-11-22: 5 mg/h via INTRAVENOUS
  Filled 2021-11-22: qty 100

## 2021-11-22 MED ORDER — PROCHLORPERAZINE EDISYLATE 10 MG/2ML IJ SOLN: 5.0000 mg | Freq: Four times a day (QID) | INTRAMUSCULAR | Status: DC | PRN

## 2021-11-22 MED ORDER — LIDOCAINE-EPINEPHRINE 1 %-1:100000 IJ SOLN
INTRAMUSCULAR | Status: AC
  Filled 2021-11-22: qty 1

## 2021-11-22 MED ORDER — ROCURONIUM BROMIDE 10 MG/ML (PF) SYRINGE
PREFILLED_SYRINGE | INTRAVENOUS | Status: DC | PRN
Start: 2021-11-22 — End: 2021-11-22
  Administered 2021-11-22: 70 mg via INTRAVENOUS
  Administered 2021-11-22: 100 mg via INTRAVENOUS
  Administered 2021-11-22: 30 mg via INTRAVENOUS

## 2021-11-22 MED ORDER — DOCUSATE SODIUM 50 MG/5ML PO LIQD
100.0000 mg | Freq: Two times a day (BID) | ORAL | Status: DC
  Administered 2021-11-22 – 2021-11-24 (×4): 100 mg
  Filled 2021-11-22 (×4): qty 10

## 2021-11-22 MED ORDER — CEFAZOLIN SODIUM-DEXTROSE 2-4 GM/100ML-% IV SOLN
2.0000 g | Freq: Once | INTRAVENOUS | Status: AC
  Administered 2021-11-22: 2 g via INTRAVENOUS

## 2021-11-22 MED ORDER — ACETAMINOPHEN 325 MG PO TABS
650.0000 mg | ORAL_TABLET | ORAL | Status: DC | PRN
  Administered 2021-11-22: 650 mg
  Filled 2021-11-22: qty 2

## 2021-11-22 MED ORDER — DEXAMETHASONE SODIUM PHOSPHATE 10 MG/ML IJ SOLN
INTRAMUSCULAR | Status: DC | PRN
  Administered 2021-11-22: 10 mg via INTRAVENOUS

## 2021-11-22 MED ORDER — CHLORHEXIDINE GLUCONATE 0.12% ORAL RINSE (MEDLINE KIT)
15.0000 mL | Freq: Two times a day (BID) | OROMUCOSAL | Status: DC
  Administered 2021-11-22 – 2021-11-24 (×6): 15 mL via OROMUCOSAL

## 2021-11-22 MED ORDER — MUPIROCIN 2 % EX OINT
1.0000 "application " | TOPICAL_OINTMENT | Freq: Two times a day (BID) | CUTANEOUS | Status: AC
  Administered 2021-11-22 – 2021-11-26 (×9): 1 via NASAL
  Filled 2021-11-22: qty 22

## 2021-11-22 MED ORDER — CHLORHEXIDINE GLUCONATE CLOTH 2 % EX PADS
6.0000 | MEDICATED_PAD | Freq: Every day | CUTANEOUS | Status: DC
  Administered 2021-11-22 – 2021-11-24 (×3): 6 via TOPICAL

## 2021-11-22 MED ORDER — MIDAZOLAM HCL 5 MG/5ML IJ SOLN
INTRAMUSCULAR | Status: DC | PRN
  Administered 2021-11-22 (×2): 2 mg via INTRAVENOUS

## 2021-11-22 MED ORDER — THIAMINE HCL 100 MG/ML IJ SOLN
100.0000 mg | Freq: Every day | INTRAMUSCULAR | Status: DC
  Administered 2021-11-22 – 2021-11-25 (×4): 100 mg via INTRAVENOUS
  Filled 2021-11-22 (×4): qty 2

## 2021-11-22 MED ORDER — PROCHLORPERAZINE MALEATE 10 MG PO TABS
10.0000 mg | ORAL_TABLET | Freq: Four times a day (QID) | ORAL | Status: DC | PRN
  Filled 2021-11-22: qty 1

## 2021-11-22 MED ORDER — ROCURONIUM BROMIDE 10 MG/ML (PF) SYRINGE
PREFILLED_SYRINGE | INTRAVENOUS | Status: AC
  Filled 2021-11-22: qty 10

## 2021-11-22 MED ORDER — KETOROLAC TROMETHAMINE 15 MG/ML IJ SOLN: 30.0000 mg | Freq: Four times a day (QID) | INTRAMUSCULAR | Status: DC

## 2021-11-22 MED ORDER — ORAL CARE MOUTH RINSE
15.0000 mL | OROMUCOSAL | Status: DC
  Administered 2021-11-22 – 2021-11-24 (×24): 15 mL via OROMUCOSAL

## 2021-11-22 MED ORDER — MIDAZOLAM HCL 2 MG/2ML IJ SOLN
INTRAMUSCULAR | Status: AC
  Filled 2021-11-22: qty 2

## 2021-11-22 MED ORDER — LACTATED RINGERS IV SOLN: INTRAVENOUS | Status: DC | PRN

## 2021-11-22 MED ORDER — ONDANSETRON HCL 4 MG/2ML IJ SOLN: 4.0000 mg | Freq: Four times a day (QID) | INTRAMUSCULAR | Status: DC | PRN

## 2021-11-22 MED ORDER — FENTANYL CITRATE (PF) 250 MCG/5ML IJ SOLN
INTRAMUSCULAR | Status: AC
  Filled 2021-11-22: qty 5

## 2021-11-22 MED ORDER — LIDOCAINE-EPINEPHRINE 1 %-1:100000 IJ SOLN
INTRAMUSCULAR | Status: DC | PRN
Start: 2021-11-22 — End: 2021-11-22
  Administered 2021-11-22: 6 mL

## 2021-11-22 MED ORDER — DIPHENHYDRAMINE HCL 50 MG/ML IJ SOLN
25.0000 mg | Freq: Four times a day (QID) | INTRAMUSCULAR | Status: DC | PRN
  Administered 2021-11-24 – 2021-12-02 (×5): 25 mg via INTRAVENOUS
  Filled 2021-11-22 (×5): qty 1

## 2021-11-22 MED ORDER — OXYCODONE HCL 5 MG/5ML PO SOLN
5.0000 mg | ORAL | Status: DC | PRN
  Administered 2021-11-23: 10 mg
  Filled 2021-11-22 (×2): qty 10

## 2021-11-22 MED ORDER — ACETAMINOPHEN 500 MG PO TABS
1000.0000 mg | ORAL_TABLET | Freq: Four times a day (QID) | ORAL | Status: DC
  Administered 2021-11-22 – 2021-11-26 (×13): 1000 mg
  Filled 2021-11-22 (×15): qty 2

## 2021-11-22 MED ORDER — KCL IN DEXTROSE-NACL 20-5-0.45 MEQ/L-%-% IV SOLN
INTRAVENOUS | Status: DC
  Filled 2021-11-22: qty 1000

## 2021-11-22 MED ORDER — METHOCARBAMOL 500 MG PO TABS: 1000.0000 mg | ORAL_TABLET | Freq: Three times a day (TID) | ORAL | Status: DC

## 2021-11-22 MED ORDER — DOCUSATE SODIUM 100 MG PO CAPS: 100.0000 mg | ORAL_CAPSULE | Freq: Two times a day (BID) | ORAL | Status: DC

## 2021-11-22 MED ORDER — FENTANYL BOLUS VIA INFUSION
50.0000 ug | INTRAVENOUS | Status: DC | PRN
  Administered 2021-11-22 – 2021-11-24 (×9): 100 ug via INTRAVENOUS
  Filled 2021-11-22: qty 100

## 2021-11-22 MED ORDER — DEXMEDETOMIDINE HCL IN NACL 400 MCG/100ML IV SOLN
0.4000 ug/kg/h | INTRAVENOUS | Status: DC
  Administered 2021-11-22: 0.7 ug/kg/h via INTRAVENOUS
  Administered 2021-11-22: 0.8 ug/kg/h via INTRAVENOUS
  Administered 2021-11-22: 0.4 ug/kg/h via INTRAVENOUS
  Administered 2021-11-23: 0.8 ug/kg/h via INTRAVENOUS
  Administered 2021-11-23: 0.9 ug/kg/h via INTRAVENOUS
  Administered 2021-11-23: 1 ug/kg/h via INTRAVENOUS
  Administered 2021-11-23: 0.8 ug/kg/h via INTRAVENOUS
  Administered 2021-11-24 (×3): 1.2 ug/kg/h via INTRAVENOUS
  Administered 2021-11-24: 0.9 ug/kg/h via INTRAVENOUS
  Administered 2021-11-24: 1.2 ug/kg/h via INTRAVENOUS
  Administered 2021-11-24: 0.9 ug/kg/h via INTRAVENOUS
  Administered 2021-11-25 (×2): 1.2 ug/kg/h via INTRAVENOUS
  Administered 2021-11-25: 1 ug/kg/h via INTRAVENOUS
  Administered 2021-11-25: 0.6 ug/kg/h via INTRAVENOUS
  Administered 2021-11-26 (×2): 1.2 ug/kg/h via INTRAVENOUS
  Filled 2021-11-22: qty 100
  Filled 2021-11-22: qty 200
  Filled 2021-11-22: qty 100
  Filled 2021-11-22: qty 200
  Filled 2021-11-22 (×12): qty 100

## 2021-11-22 MED ORDER — ONDANSETRON 4 MG PO TBDP: 4.0000 mg | ORAL_TABLET | Freq: Four times a day (QID) | ORAL | Status: DC | PRN

## 2021-11-22 MED ORDER — FOLIC ACID 5 MG/ML IJ SOLN
1.0000 mg | Freq: Every day | INTRAMUSCULAR | Status: DC
  Administered 2021-11-23 – 2021-11-25 (×3): 1 mg via INTRAVENOUS
  Filled 2021-11-22 (×5): qty 0.2

## 2021-11-22 SURGICAL SUPPLY — 42 items
BAG COUNTER SPONGE SURGICOUNT (BAG) ×5 IMPLANT
BAG SPNG CNTER NS LX DISP (BAG) ×3
BLADE SURG 15 STRL LF DISP TIS (BLADE) ×4 IMPLANT
BLADE SURG 15 STRL SS (BLADE) ×4
CANISTER SUCT 1200ML W/VALVE (MISCELLANEOUS) ×5 IMPLANT
CANISTER SUCT 3000ML PPV (MISCELLANEOUS) ×5 IMPLANT
COVER SURGICAL LIGHT HANDLE (MISCELLANEOUS) ×5 IMPLANT
DRAPE HALF SHEET 40X57 (DRAPES) ×3 IMPLANT
ELECT COATED BLADE 2.86 ST (ELECTRODE) ×5 IMPLANT
ELECT REM PT RETURN 9FT ADLT (ELECTROSURGICAL) ×4
ELECTRODE REM PT RTRN 9FT ADLT (ELECTROSURGICAL) ×4 IMPLANT
GAUZE SPONGE 2X2 8PLY STRL LF (GAUZE/BANDAGES/DRESSINGS) ×4 IMPLANT
GLOVE SURG ENC MOIS LTX SZ6 (GLOVE) ×5 IMPLANT
GOWN STRL REUS W/ TWL LRG LVL3 (GOWN DISPOSABLE) ×8 IMPLANT
GOWN STRL REUS W/TWL LRG LVL3 (GOWN DISPOSABLE) ×8
KIT BASIN OR (CUSTOM PROCEDURE TRAY) ×5 IMPLANT
KIT SPLINT NASAL DENVER LRG BE (GAUZE/BANDAGES/DRESSINGS) IMPLANT
KIT SPLINT NASAL DENVER MIN BE (GAUZE/BANDAGES/DRESSINGS) IMPLANT
KIT SPLINT NASAL DENVER PET BE (GAUZE/BANDAGES/DRESSINGS) IMPLANT
KIT SPLINT NASAL DENVER SM BEI (GAUZE/BANDAGES/DRESSINGS) IMPLANT
KIT TURNOVER KIT B (KITS) ×5 IMPLANT
NDL PRECISIONGLIDE 27X1.5 (NEEDLE) ×3 IMPLANT
NEEDLE PRECISIONGLIDE 27X1.5 (NEEDLE) ×4 IMPLANT
NS IRRIG 1000ML POUR BTL (IV SOLUTION) ×5 IMPLANT
PAD ARMBOARD 7.5X6 YLW CONV (MISCELLANEOUS) ×10 IMPLANT
PATTIES SURGICAL .5 X3 (DISPOSABLE) ×5 IMPLANT
PENCIL BUTTON HOLSTER BLD 10FT (ELECTRODE) ×5 IMPLANT
SCISSORS WIRE ANG 4 3/4 DISP (INSTRUMENTS) ×5 IMPLANT
SCREW LOCK SELFDRIL 2.0X8M MMF (Screw) ×14 IMPLANT
SCREW LOCKING SELF DRILL 2.0X6 (Screw) ×4 IMPLANT
SPLINT NASAL DOYLE BI-VL (GAUZE/BANDAGES/DRESSINGS) IMPLANT
SPLINT NASAL THERMO PLAST (MISCELLANEOUS) IMPLANT
SPONGE GAUZE 2X2 STER 10/PKG (GAUZE/BANDAGES/DRESSINGS) ×1
SUT BONE WAX W31G (SUTURE) ×2 IMPLANT
SUT CHROMIC 4 0 P 3 18 (SUTURE) ×2 IMPLANT
SUT CHROMIC 4 0 PS 2 18 (SUTURE) ×2 IMPLANT
SUT PROLENE 2 0 CT2 30 (SUTURE) ×3 IMPLANT
SUT STEEL 1 (SUTURE) ×2 IMPLANT
SUT STEEL 4 (SUTURE) ×6 IMPLANT
TRAY ENT MC OR (CUSTOM PROCEDURE TRAY) ×5 IMPLANT
TRAY FOLEY MTR SLVR 14FR STAT (SET/KITS/TRAYS/PACK) IMPLANT
WATER STERILE IRR 1000ML POUR (IV SOLUTION) ×5 IMPLANT

## 2021-11-22 NOTE — Progress Notes (Addendum)
1240 Received call from [facility] MD Radiologist regarding NTT and NG placement; ETT to be retracted 2-3 cm, NG to be advanced 4-5 cm.  RN called MD Ellender (Anesthesia) and RT to bedside to retract NTT 1300.  1415 RN saw resulted CXR--recommended withdrawing NTT again, notified RT and MD Ellender. RT to bedside 1445, retracted NTT 2cm; MD Lovick at bedside and confirmed placement on CXR and pt status.   1540 received call from Radiology recommending retraction of NTT another 3cm; per MD Lovick, will keep NTT at current position.  RT M. [facility] aware.

## 2021-11-22 NOTE — Anesthesia Postprocedure Evaluation (Signed)
Anesthesia Post Note  Patient: Joseph [name]  Procedure(s) Performed: INTERMAXILLARY FIXATION (Mouth) FACIAL LACERATION REPAIR     Patient location during evaluation: SICU Anesthesia Type: General Level of consciousness: sedated Pain management: pain level controlled Vital Signs Assessment: post-procedure vital signs reviewed and stable Respiratory status: patient remains intubated per anesthesia plan Cardiovascular status: stable Postop Assessment: no apparent nausea or vomiting Anesthetic complications: no Comments: Called by ICU RN and notified of CXR results. Nasal ETT tube pulled back 2 cm with assistance of ICU RN and RT.    No notable events documented.  Last Vitals:  Vitals:   11/22/21 1600 11/22/21 1700  BP: 131/83 119/76  Pulse: 77 75  Resp: (!) 28 (!) 28  Temp: (!) 38.3 C (!) 38.3 C  SpO2: 96% 95%    Last Pain:  Vitals:   11/22/21 1200  TempSrc: Bladder                 Randle Shatzer P Rhylin Venters

## 2021-11-22 NOTE — Progress Notes (Signed)
°  Transition of Care [facility]) Screening Note   Patient Details  Name: Joseph [name] Date of Birth: Oct 22, 1966   Transition of [facility]) CM/SW Contact:    Alfredia Ferguson, LCSW Phone Number: 11/22/2021, 8:58 AM    Transition of Care Department [facility]) has reviewed patient and no TOC needs have been identified at this time. We will continue to monitor patient advancement through interdisciplinary progression rounds. If new patient transition needs arise, please place a TOC consult.

## 2021-11-22 NOTE — ED Notes (Signed)
Attempts made to call significant other listed in patients chart [phone] [name]

## 2021-11-22 NOTE — Anesthesia Postprocedure Evaluation (Signed)
Anesthesia Post Note  Patient: Joseph [name]  Procedure(s) Performed: INTERMAXILLARY FIXATION (Mouth) FACIAL LACERATION REPAIR     Patient location during evaluation: SICU Anesthesia Type: General Level of consciousness: sedated Pain management: pain level controlled Vital Signs Assessment: post-procedure vital signs reviewed and stable Respiratory status: patient remains intubated per anesthesia plan Cardiovascular status: stable Postop Assessment: no apparent nausea or vomiting Anesthetic complications: no Comments: Called by ICU RN and notified of CXR results. Nasal ETT pulled back 2 cm in the ICU.   No notable events documented.  Last Vitals:  Vitals:   11/22/21 1600 11/22/21 1700  BP: 131/83 119/76  Pulse: 77 75  Resp: (!) 28 (!) 28  Temp: (!) 38.3 C (!) 38.3 C  SpO2: 96% 95%    Last Pain:  Vitals:   11/22/21 1200  TempSrc: Bladder                 Alyssa Rotondo P Franny Selvage

## 2021-11-22 NOTE — Progress Notes (Signed)
Latest Reference Range & Units 11/22/21 04:49  Lactic Acid, Venous 0.5 - 1.9 mmol/L 2.5 (HH)  (HH): Data is critically high   Trauma notified

## 2021-11-22 NOTE — Progress Notes (Signed)
RT assisted Anesthesiologist Ellender retract ETT 2-3 cm per CXR.
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
## 2021-11-22 NOTE — Op Note (Signed)
Operative Note   DATE OF OPERATION: 1.7.2023  LOCATION: [facility]-inpatient  SURGICAL DIVISION: Plastic Surgery  PREOPERATIVE DIAGNOSES:  1. Open right mandibular body fracture 2. Closed left mandibular angle fracture 3. Closed nasal bone fracture left 4. Upper lip lacerations  POSTOPERATIVE DIAGNOSES:  same  PROCEDURE:  1. Intermaxillary fixation 2. Simple closure upper lip lacerations total 4 cm  SURGEON: Glenna Fellows MD MBA  ASSISTANT: none  ANESTHESIA:  General.   EBL: 100 ml  COMPLICATIONS: None immediate.   INDICATIONS FOR PROCEDURE:  The patient, Joseph [name], is a 56 y.o. male born on Mar 14, 1966, is here for stabilization bilateral mandibular fractures.    FINDINGS: Multiple absent teeth mandible and maxilla with no molar contact possible when brought into occlusion. No open or cross bites noted across incisors, canines and premolars when brought into occlusion. Multiple carious teeth noted.  DESCRIPTION OF PROCEDURE:  The patient was taken directly to the operating room from ICU. Following conversion from oral to nasotracheal intubation, IV antibiotics were given. The patient's operative site was prepped and draped in usual fashion. A time out was performed.  Local anesthetic infiltrated along lip wound margins and to perform bilateral infraorbital and mental nerve blocks. Fracture site irrigated. [employer] Stryker Hybrid intermaxillary system utilized and arch bars secured with 6 mm and 8 mm screws to alveolar bone of maxilla and mandible. 22 Ga wire used to bing patient into occlusion. As noted in findings, no posterior contact possible due to absent dentition. 4-0 chromic used to approximate lacerated gingiva and mucosal tissue at right mandible body fracture site. Nasogastric tube placed and stomach contents suctioned.  Upper lip lacerations trimmed to fresh tissue. Simple repair three separate lacerations completed with interrupted 4-0 chromic. Total length repair  4 cm. Antibiotic ointment applied.  The patient was returned to ICU in stable condition.   SPECIMENS: none  DRAINS: none

## 2021-11-22 NOTE — Anesthesia Preprocedure Evaluation (Signed)
Anesthesia Evaluation   Patient unresponsive    Reviewed: Allergy & Precautions, Patient's Chart, lab work & pertinent test resultsPreop documentation limited or incomplete due to emergent nature of procedure.  Airway Mallampati: Intubated       Dental  (+) Missing, Poor Dentition   Pulmonary  Intubated      + intubated    Cardiovascular hypertension,  Rhythm:Regular Rate:Tachycardia     Neuro/Psych Sedated    GI/Hepatic (+)     substance abuse  alcohol use and cocaine use,   Endo/Other    Renal/GU      Musculoskeletal   Abdominal (+) + obese,   Peds  Hematology   Anesthesia Other Findings Mandibular Fracture  Reproductive/Obstetrics                             Anesthesia Physical Anesthesia Plan  ASA: 3  Anesthesia Plan: General   Post-op Pain Management:    Induction: Intravenous and Inhalational  PONV Risk Score and Plan: 2 and Ondansetron, Dexamethasone, Midazolam and Treatment may vary due to age or medical condition  Airway Management Planned: Nasal ETT and Oral ETT  Additional Equipment:   Intra-op Plan:   Post-operative Plan: Post-operative intubation/ventilation  Informed Consent:     Only emergency history available  Plan Discussed with: Surgeon and CRNA  Anesthesia Plan Comments: (Will attempt right sided nasal intubation per surgeon request)        Anesthesia Quick Evaluation

## 2021-11-22 NOTE — Progress Notes (Signed)
See Op Note.  Patient will remain in rigid intermaxillary fixation (ie jaw wired shut) for 4-6 weeks.  Given open fracture, IV antibiotics continued post operatively.  Ok to extubate from Plastics standpoint once Trauma/ICU feels able to do this safely.  There is nondisplaced fracture left nasal bone. I placed NGT through this nare. Ok to adjust as needed.  Liquid/jaw fracture diet once extubated and recommend dietician consult prior to discharge once extubated and patient can participate in learning diet.  Bone wax for use over wires sent to floor with patient for any discomfort/poking wires.  Continue Peridex oral care.  No family present at time of procedure completion. Unable to reach SO listed in chart by phone at procedure completion.  Glenna Fellows, MD [facility] Plastic & Reconstructive Surgery
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
## 2021-11-22 NOTE — TOC CAGE-AID Note (Signed)
Transition of Care [facility]) - CAGE-AID Screening   Patient Details  Name: Joseph [name] MRN: [id number] Date of Birth: 09-08-1966      Judie Bonus, RN Phone Number: 11/22/2021, 2:50 AM   Clinical Narrative:  Pt unable to participate d/t intubation on arrival for airway protection. Pt +etoh,cocaine and benzo's on arrival.    CAGE-AID Screening: Substance Abuse Screening unable to be completed due to: : Patient unable to participate             Substance Abuse Education Offered: No  Substance abuse interventions: Other (must comment) (Pt intubated)

## 2021-11-22 NOTE — Progress Notes (Signed)
Patients pupils on arrival to unit at [place] pinpoint non reactive. Right pupil round and Left pupil oval horizontally. MD notified.

## 2021-11-22 NOTE — Transfer of Care (Signed)
Immediate Anesthesia Transfer of Care Note  Patient: Joseph [name]  Procedure(s) Performed: INTERMAXILLARY FIXATION (Mouth) FACIAL LACERATION REPAIR  Patient Location: ICU  Anesthesia Type:General  Level of Consciousness: sedated and Patient remains intubated per anesthesia plan  Airway & Oxygen Therapy: Patient remains intubated per anesthesia plan and Patient placed on Ventilator (see vital sign flow sheet for setting)  Post-op Assessment: Report given to RN and Post -op Vital signs reviewed and stable  Post vital signs: Reviewed and stable  Last Vitals:  Vitals Value Taken Time  BP 155/99 11/22/21 1137  Temp    Pulse 108 11/22/21 1137  Resp 28 11/22/21 1137  SpO2 94 % 11/22/21 1137    Last Pain:  Vitals:   11/22/21 0800  TempSrc: Oral         Complications: No notable events documented.

## 2021-11-22 NOTE — Anesthesia Procedure Notes (Signed)
Procedure Name: Intubation Date/Time: 11/22/2021 9:28 AM Performed by: Kyung Rudd, CRNA Pre-anesthesia Checklist: Patient identified, Emergency Drugs available, Suction available, Patient being monitored and Timeout performed Patient Re-evaluated:Patient Re-evaluated prior to induction Oxygen Delivery Method: Circle system utilized Preoxygenation: Pre-oxygenation with 100% oxygen Induction Type: Combination inhalational/ intravenous induction and Inhalational induction with existing ETT Laryngoscope Size: Glidescope and 3 Nasal Tubes: Right, Nasal prep performed and Nasal Rae Tube size: 7.0 mm Number of attempts: 1 Airway Equipment and Method: Video-laryngoscopy and Fiberoptic brochoscope Placement Confirmation: ETT inserted through vocal cords under direct vision, positive ETCO2 and breath sounds checked- equal and bilateral Tube secured with: Tape Dental Injury: Teeth and Oropharynx as per pre-operative assessment

## 2021-11-23 ENCOUNTER — Encounter (HOSPITAL_COMMUNITY): Payer: Self-pay | Admitting: Plastic Surgery

## 2021-11-23 LAB — POCT I-STAT 7, (LYTES, BLD GAS, ICA,H+H)
Acid-Base Excess: 1 mmol/L (ref 0.0–2.0)
Acid-Base Excess: 1 mmol/L (ref 0.0–2.0)
Bicarbonate: 23.6 mmol/L (ref 20.0–28.0)
Bicarbonate: 24.7 mmol/L (ref 20.0–28.0)
Calcium, Ion: 1.14 mmol/L — ABNORMAL LOW (ref 1.15–1.40)
Calcium, Ion: 1.13 mmol/L — ABNORMAL LOW (ref 1.15–1.40)
HCT: 38 % — ABNORMAL LOW (ref 39.0–52.0)
HCT: 38 % — ABNORMAL LOW (ref 39.0–52.0)
Hemoglobin: 12.9 g/dL — ABNORMAL LOW (ref 13.0–17.0)
Hemoglobin: 12.9 g/dL — ABNORMAL LOW (ref 13.0–17.0)
O2 Saturation: 99 %
O2 Saturation: 99 %
Patient temperature: 98.1
Patient temperature: 98.1
Potassium: 3.7 mmol/L (ref 3.5–5.1)
Potassium: 3.8 mmol/L (ref 3.5–5.1)
Sodium: 143 mmol/L (ref 135–145)
Sodium: 143 mmol/L (ref 135–145)
TCO2: 24 mmol/L (ref 22–32)
TCO2: 26 mmol/L (ref 22–32)
pCO2 arterial: 30.1 mmHg — ABNORMAL LOW (ref 32.0–48.0)
pCO2 arterial: 36.6 mmHg (ref 32.0–48.0)
pH, Arterial: 7.501 — ABNORMAL HIGH (ref 7.350–7.450)
pH, Arterial: 7.436 (ref 7.350–7.450)
pO2, Arterial: 107 mmHg (ref 83.0–108.0)
pO2, Arterial: 119 mmHg — ABNORMAL HIGH (ref 83.0–108.0)

## 2021-11-23 NOTE — Progress Notes (Signed)
Plastic Surgery  POD#1 IMF  Remains sedated intubated  Temp:  [99.5 F (37.5 C)-100.9 F (38.3 C)] 100 F (37.8 C) (01/08 1000) Pulse Rate:  [58-108] 62 (01/08 1000) Resp:  [20-28] 20 (01/08 1000) BP: (105-155)/(69-99) 105/69 (01/08 1000) SpO2:  [92 %-97 %] 94 % (01/08 1000) FiO2 (%):  [40 %] 40 % (01/08 0736) Weight:  [85.9 kg] 85.9 kg (01/08 0500)   PE Opens eyes and responds to exam IMF intact expected edema facial soft tissues  A/P Continue IV antibiotics for open fracture Peridex oral care Once extubated full liquid diet No family or friends present  Irene Limbo, MD [facility] Plastic & Reconstructive Surgery
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
## 2021-11-24 MED ORDER — DOCUSATE SODIUM 100 MG PO CAPS
100.0000 mg | ORAL_CAPSULE | Freq: Two times a day (BID) | ORAL | Status: DC
  Administered 2021-11-25: 100 mg via ORAL
  Filled 2021-11-24: qty 1

## 2021-11-24 MED ORDER — HALOPERIDOL LACTATE 5 MG/ML IJ SOLN
5.0000 mg | Freq: Once | INTRAMUSCULAR | Status: AC
  Administered 2021-11-24: 5 mg via INTRAVENOUS

## 2021-11-24 MED ORDER — LORAZEPAM 2 MG/ML IJ SOLN
2.0000 mg | INTRAMUSCULAR | Status: DC | PRN
  Administered 2021-11-25: 2 mg via INTRAVENOUS
  Filled 2021-11-24: qty 1

## 2021-11-24 MED ORDER — FENTANYL CITRATE PF 50 MCG/ML IJ SOSY
50.0000 ug | PREFILLED_SYRINGE | INTRAMUSCULAR | Status: DC | PRN
  Administered 2021-11-24 – 2021-11-28 (×11): 50 ug via INTRAVENOUS
  Filled 2021-11-24 (×12): qty 1

## 2021-11-24 MED ORDER — LORAZEPAM 2 MG/ML IJ SOLN
INTRAMUSCULAR | Status: AC
  Filled 2021-11-24: qty 1

## 2021-11-24 MED ORDER — POLYETHYLENE GLYCOL 3350 17 G PO PACK
17.0000 g | PACK | Freq: Every day | ORAL | Status: DC
  Administered 2021-11-25: 17 g via ORAL
  Filled 2021-11-24: qty 1

## 2021-11-24 MED ORDER — CHLORHEXIDINE GLUCONATE 0.12 % MT SOLN
15.0000 mL | Freq: Two times a day (BID) | OROMUCOSAL | Status: DC
  Administered 2021-11-25 – 2021-12-03 (×16): 15 mL via OROMUCOSAL
  Filled 2021-11-24 (×14): qty 15

## 2021-11-24 MED ORDER — ORAL CARE MOUTH RINSE
15.0000 mL | Freq: Two times a day (BID) | OROMUCOSAL | Status: DC
  Administered 2021-11-25 – 2021-12-03 (×15): 15 mL via OROMUCOSAL

## 2021-11-24 MED ORDER — LORAZEPAM 2 MG/ML IJ SOLN
2.0000 mg | Freq: Once | INTRAMUSCULAR | Status: AC
  Administered 2021-11-24: 2 mg via INTRAVENOUS

## 2021-11-24 MED ORDER — HALOPERIDOL LACTATE 5 MG/ML IJ SOLN
INTRAMUSCULAR | Status: AC
  Filled 2021-11-24: qty 1

## 2021-11-24 MED ORDER — CHLORHEXIDINE GLUCONATE CLOTH 2 % EX PADS
6.0000 | MEDICATED_PAD | Freq: Every day | CUTANEOUS | Status: DC
  Administered 2021-11-25 – 2021-12-02 (×5): 6 via TOPICAL

## 2021-11-24 MED ORDER — SODIUM CHLORIDE 0.9 % IV SOLN: INTRAVENOUS | Status: DC | PRN

## 2021-11-24 NOTE — Procedures (Signed)
Extubation Procedure Note  Patient Details:   Name: Joseph [name] DOB: Nov 16, 1966 MRN: [id number]   Airway Documentation:    Vent end date: 11/24/21 Vent end time: 1205   Evaluation  O2 sats: stable throughout Complications: No apparent complications Patient did tolerate procedure well. Bilateral Breath Sounds: Clear, Diminished   Yes  Positive cuff leak, extubated per MD order, Pt attempted to speak after, sats and vitals are stable, no complications at this time.   Elyn Peers 11/24/2021, 12:10 PM

## 2021-11-24 NOTE — Progress Notes (Signed)
Left a message for patient's SO, [name], this morning.   She just returned the call and was unaware of pt being in the hospital. [name] did say that pt has a few children, one being an adult.  She will try to get adult child's name and contact information to [place]. She intends to come to see pt later today. [name] C 9:14 AM

## 2021-11-24 NOTE — Progress Notes (Signed)
Plastic Surgery  POD#2 IMF  Remains sedated intubated. No events  Temp:  [98 F (36.7 C)-100.6 F (38.1 C)] 98 F (36.7 C) (01/09 0400) Pulse Rate:  [53-81] 53 (01/09 0700) Resp:  [20-22] 20 (01/09 0700) BP: (105-140)/(69-91) 135/86 (01/09 0700) SpO2:  [94 %-98 %] 98 % (01/09 0700) FiO2 (%):  [40 %] 40 % (01/09 0400)   PE Opens eyes and follows commands IMF intact   A/P Hopefully extubation today Continue IV antibiotics for open fracture Peridex oral care Once extubated full liquid diet   Irene Limbo, MD [facility] Plastic & Reconstructive Surgery
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
## 2021-11-24 NOTE — Progress Notes (Signed)
Additional attempt to reach SO [facility] [phone], left message.

## 2021-11-25 ENCOUNTER — Inpatient Hospital Stay (HOSPITAL_COMMUNITY): Payer: Self-pay

## 2021-11-25 LAB — BASIC METABOLIC PANEL
Anion gap: 10 (ref 5–15)
BUN: 13 mg/dL (ref 6–20)
CO2: 25 mmol/L (ref 22–32)
Calcium: 8.5 mg/dL — ABNORMAL LOW (ref 8.9–10.3)
Chloride: 114 mmol/L — ABNORMAL HIGH (ref 98–111)
Creatinine, Ser: 1.01 mg/dL (ref 0.61–1.24)
GFR, Estimated: >60 mL/min (ref >60)
Glucose, Bld: 107 mg/dL — ABNORMAL HIGH (ref 70–99)
Potassium: 3.9 mmol/L (ref 3.5–5.1)
Sodium: 149 mmol/L — ABNORMAL HIGH (ref 135–145)

## 2021-11-25 LAB — CBC
HCT: 38.7 % — ABNORMAL LOW (ref 39.0–52.0)
Hemoglobin: 12.6 g/dL — ABNORMAL LOW (ref 13.0–17.0)
MCH: 30.4 pg (ref 26.0–34.0)
MCHC: 32.6 g/dL (ref 30.0–36.0)
MCV: 93.3 fL (ref 80.0–100.0)
Platelets: 249 10*3/uL (ref 150–400)
RBC: 4.15 MIL/uL — ABNORMAL LOW (ref 4.22–5.81)
RDW: 12.6 % (ref 11.5–15.5)
WBC: 16.8 10*3/uL — ABNORMAL HIGH (ref 4.0–10.5)
nRBC: 0 % (ref 0.0–0.2)

## 2021-11-25 LAB — MAGNESIUM
Magnesium: 2.3 mg/dL (ref 1.7–2.4)
Magnesium: 2.3 mg/dL (ref 1.7–2.4)

## 2021-11-25 LAB — GLUCOSE, CAPILLARY: Glucose-Capillary: 173 mg/dL — ABNORMAL HIGH (ref 70–99)

## 2021-11-25 LAB — PHOSPHORUS
Phosphorus: 3.4 mg/dL (ref 2.5–4.6)
Phosphorus: 2.8 mg/dL (ref 2.5–4.6)

## 2021-11-25 LAB — TRIGLYCERIDES: Triglycerides: 140 mg/dL (ref <150)

## 2021-11-25 MED ORDER — SPIRITUS FRUMENTI
1.0000 | Freq: Two times a day (BID) | ORAL | Status: DC
  Administered 2021-11-25 (×2): 1
  Filled 2021-11-25 (×3): qty 1

## 2021-11-25 MED ORDER — CHLORDIAZEPOXIDE HCL 5 MG PO CAPS
10.0000 mg | ORAL_CAPSULE | Freq: Three times a day (TID) | ORAL | Status: DC
  Administered 2021-11-25 (×3): 10 mg
  Filled 2021-11-25 (×3): qty 2

## 2021-11-25 MED ORDER — PIVOT 1.5 CAL PO LIQD
1000.0000 mL | ORAL | Status: DC
  Administered 2021-11-25: 1000 mL

## 2021-11-25 MED ORDER — DOCUSATE SODIUM 50 MG/5ML PO LIQD
100.0000 mg | Freq: Two times a day (BID) | ORAL | Status: DC
  Administered 2021-11-25: 100 mg
  Filled 2021-11-25: qty 10

## 2021-11-25 MED ORDER — THIAMINE HCL 100 MG PO TABS
100.0000 mg | ORAL_TABLET | Freq: Every day | ORAL | Status: DC
  Filled 2021-11-25: qty 1

## 2021-11-25 MED ORDER — PROSOURCE TF PO LIQD
45.0000 mL | Freq: Two times a day (BID) | ORAL | Status: DC
  Administered 2021-11-25: 45 mL
  Filled 2021-11-25: qty 45

## 2021-11-25 MED ORDER — ADULT MULTIVITAMIN W/MINERALS CH
1.0000 | ORAL_TABLET | Freq: Every day | ORAL | Status: DC
  Administered 2021-11-25: 1
  Filled 2021-11-25 (×2): qty 1

## 2021-11-25 MED ORDER — FOLIC ACID 1 MG PO TABS: 1.0000 mg | ORAL_TABLET | Freq: Every day | ORAL | Status: DC

## 2021-11-25 MED ORDER — DEXTROSE 5 % IV SOLN: INTRAVENOUS | Status: DC

## 2021-11-25 MED ORDER — LORAZEPAM 2 MG/ML IJ SOLN
1.0000 mg | INTRAMUSCULAR | Status: DC | PRN
  Administered 2021-11-25 – 2021-12-01 (×11): 1 mg via INTRAVENOUS
  Filled 2021-11-25 (×11): qty 1

## 2021-11-25 MED ORDER — HALOPERIDOL LACTATE 5 MG/ML IJ SOLN
10.0000 mg | Freq: Four times a day (QID) | INTRAMUSCULAR | Status: DC | PRN
  Administered 2021-12-01: 10 mg via INTRAVENOUS
  Filled 2021-11-25: qty 2

## 2021-11-25 MED ORDER — PIVOT 1.5 CAL PO LIQD
1000.0000 mL | ORAL | Status: DC
  Administered 2021-11-25 – 2021-11-26 (×2): 1000 mL

## 2021-11-25 MED ORDER — POLYETHYLENE GLYCOL 3350 17 G PO PACK: 17.0000 g | PACK | Freq: Every day | ORAL | Status: DC

## 2021-11-25 MED ORDER — VITAL HIGH PROTEIN PO LIQD: 1000.0000 mL | ORAL | Status: DC
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
## 2021-11-25 NOTE — Progress Notes (Signed)
Upon first assessment patient seemed to be agitated but redirectable. Precedex gtt noticed to be maxed out at 1.2 mg/kg/hr. Patient calmed down after drinking some water. Patient became very agitated with every assessment question and refused to answer anything with out calling his wife. This RN attempted to call his wife but she did not answer. The patient then became very combative, screaming, paranoid, and using vulgar language saying we were trying to harm him. Multiple nurses on the unit tried to calmly soothe him and reassure him that he was safe and at the hospital. He refused to accept any answer and just became more combative and agitated with just calmly talking to him. This nurse was finally able to get a hold of his wife on speaker phone, patient then proceeded to use cuss words and scream at his wife on the phone. Trauma MD was notified of situation and that talking calmly to the patient was not de-escalating the situation at all. New order for haldol 5 mg one time dose and ativan 2 mg one time dose if the haldol did not work. Haldol was given and no change was seen in the patient's behavior. Ativan was given and patient calmed down from a RASS of 4 to a -1. New order was placed shortly after for ativan 2 mg q2 PRN. Will continue to monitor patient.
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
## 2021-11-25 NOTE — Progress Notes (Signed)
Plastic Surgery  POD#3 IMF  Extubated yesterday, events noted.   Temp:  [97.5 F (36.4 C)-99.7 F (37.6 C)] 97.5 F (36.4 C) (01/10 0800) Pulse Rate:  [63-123] 66 (01/10 0800) Resp:  [13-25] 13 (01/10 0800) BP: (102-179)/(61-135) 154/100 (01/10 0800) SpO2:  [94 %-100 %] 97 % (01/10 0800)   PO 240 ml  PE Recently sedated IMF intact   A/P IMF for 4-6 weeks Completed IV antibiotics for open fracture Peridex oral care- patient not allowing Advance to full liquid diet - patient not able to participate in diet teaching at this time   Glenna Fellows, MD [facility] Plastic & Reconstructive Surgery
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
## 2021-11-25 NOTE — Progress Notes (Signed)
Patient became awake and agitated around 0830.  Patient kept trying to take off restraints and demanding to be let go so he could leave.  I explained to the patient that being calm would help out everything.  He was not redirectable.  I then gave the patient 2mg  IV of Ativan.  I turned off the Precedex gtt in hopes that patients agitation could be controlled with prns instead of a continuous IV medication.
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
## 2021-11-26 LAB — CBC
HCT: 38.8 % — ABNORMAL LOW (ref 39.0–52.0)
Hemoglobin: 12.6 g/dL — ABNORMAL LOW (ref 13.0–17.0)
MCH: 30.3 pg (ref 26.0–34.0)
MCHC: 32.5 g/dL (ref 30.0–36.0)
MCV: 93.3 fL (ref 80.0–100.0)
Platelets: 266 10*3/uL (ref 150–400)
RBC: 4.16 MIL/uL — ABNORMAL LOW (ref 4.22–5.81)
RDW: 12.4 % (ref 11.5–15.5)
WBC: 14.3 10*3/uL — ABNORMAL HIGH (ref 4.0–10.5)
nRBC: 0 % (ref 0.0–0.2)

## 2021-11-26 LAB — BASIC METABOLIC PANEL
Anion gap: 7 (ref 5–15)
BUN: 14 mg/dL (ref 6–20)
CO2: 27 mmol/L (ref 22–32)
Calcium: 8.2 mg/dL — ABNORMAL LOW (ref 8.9–10.3)
Chloride: 113 mmol/L — ABNORMAL HIGH (ref 98–111)
Creatinine, Ser: 0.85 mg/dL (ref 0.61–1.24)
GFR, Estimated: >60 mL/min (ref >60)
Glucose, Bld: 156 mg/dL — ABNORMAL HIGH (ref 70–99)
Potassium: 3.9 mmol/L (ref 3.5–5.1)
Sodium: 147 mmol/L — ABNORMAL HIGH (ref 135–145)

## 2021-11-26 LAB — PHOSPHORUS
Phosphorus: 2.7 mg/dL (ref 2.5–4.6)
Phosphorus: 2.2 mg/dL — ABNORMAL LOW (ref 2.5–4.6)

## 2021-11-26 LAB — GLUCOSE, CAPILLARY: Glucose-Capillary: 142 mg/dL — ABNORMAL HIGH (ref 70–99)

## 2021-11-26 LAB — MAGNESIUM
Magnesium: 2.1 mg/dL (ref 1.7–2.4)
Magnesium: 2 mg/dL (ref 1.7–2.4)

## 2021-11-26 MED ORDER — FOLIC ACID 1 MG PO TABS
1.0000 mg | ORAL_TABLET | Freq: Every day | ORAL | Status: DC
  Administered 2021-11-29 – 2021-12-03 (×5): 1 mg via ORAL
  Filled 2021-11-26 (×7): qty 1

## 2021-11-26 MED ORDER — OXYCODONE HCL 5 MG PO TABS: 5.0000 mg | ORAL_TABLET | ORAL | Status: DC | PRN

## 2021-11-26 MED ORDER — ADULT MULTIVITAMIN W/MINERALS CH
1.0000 | ORAL_TABLET | Freq: Every day | ORAL | Status: DC
  Administered 2021-11-29 – 2021-12-03 (×5): 1 via ORAL
  Filled 2021-11-26 (×6): qty 1

## 2021-11-26 MED ORDER — ENSURE ENLIVE PO LIQD
237.0000 mL | Freq: Two times a day (BID) | ORAL | Status: DC
  Administered 2021-11-27: 237 mL via ORAL
  Administered 2021-11-27: 100 mL via ORAL
  Administered 2021-11-28 (×2): 237 mL via ORAL
  Filled 2021-11-26: qty 474

## 2021-11-26 MED ORDER — SPIRITUS FRUMENTI
1.0000 | Freq: Two times a day (BID) | ORAL | Status: DC
  Administered 2021-11-26 – 2021-11-29 (×2): 1 via ORAL
  Filled 2021-11-26 (×8): qty 1

## 2021-11-26 MED ORDER — CHLORDIAZEPOXIDE HCL 5 MG PO CAPS
10.0000 mg | ORAL_CAPSULE | Freq: Three times a day (TID) | ORAL | Status: DC
  Administered 2021-11-26 – 2021-11-28 (×9): 10 mg via ORAL
  Filled 2021-11-26 (×10): qty 2

## 2021-11-26 MED ORDER — ACETAMINOPHEN 325 MG PO TABS
650.0000 mg | ORAL_TABLET | Freq: Four times a day (QID) | ORAL | Status: DC
  Administered 2021-11-27 – 2021-11-28 (×3): 650 mg via ORAL
  Filled 2021-11-26 (×4): qty 2

## 2021-11-26 MED ORDER — ACETAMINOPHEN 160 MG/5ML PO SOLN: 650.0000 mg | Freq: Four times a day (QID) | ORAL | Status: DC

## 2021-11-26 MED ORDER — THIAMINE HCL 100 MG PO TABS
100.0000 mg | ORAL_TABLET | Freq: Every day | ORAL | Status: DC
  Administered 2021-11-29 – 2021-12-03 (×5): 100 mg via ORAL
  Filled 2021-11-26 (×6): qty 1
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
## 2021-11-26 NOTE — Progress Notes (Signed)
Plastic Surgery  POD#4 IMF  TF started but NGT found out this am, patient currently on oxygen supplementation  Temp:  [97.1 F (36.2 C)-98.6 F (37 C)] 97.9 F (36.6 C) (01/11 0400) Pulse Rate:  [64-92] 74 (01/11 0600) Resp:  [11-20] 18 (01/11 0600) BP: (115-170)/(65-99) 146/96 (01/11 0600) SpO2:  [91 %-97 %] 97 % (01/11 0600) Weight:  [84.2 kg] 84.2 kg (01/11 0500)     PE Recently sedated IMF intact   A/P IMF for 4-6 weeks Peridex oral care Advance to full liquid diet - patient not able to participate in diet teaching at this time If patient able to take protein shakes/tolerate PO would leave NGT out   Glenna Fellows, MD [facility] Plastic & Reconstructive Surgery

## 2021-11-26 NOTE — Progress Notes (Signed)
Patient stated that he stays at [facility] ([facility] of [facility]) and asked me to call them to let them know he was here. I called them at [phone] and was not able to reach anyone.  I did not leave a voicemail when prompted. Might try to call later. Jahmere Bramel C 11:53 AM
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
## 2021-11-26 NOTE — Progress Notes (Signed)
Went in to patient's room to assess and found that he had removed NG tube and has copious amounts of saliva/sputum coming out of his mouth. Pt was desating into the mid 80s.  Otho was increased from 3-6 liters with little change. Pt is very difficult to arouse.  Precedex was turned off, non-rebreather was applied and pts SATs increased quickly.  Pt is now SATing in the mid 90s. Pt is becoming more responsive with minor stimulus.  Precedex will remain off. Dr B. Thompson nwas called to update on patient's status. Will continue to monitor. Farin Buhman C 8:09 AM
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
## 2021-11-26 NOTE — Progress Notes (Signed)
Inpatient Rehab Admissions Coordinator:  ? ?Per therapy recommendations,  patient was screened for CIR candidacy by Jamice Carreno, MS, CCC-SLP. At this time, Pt. Appears to be a a potential candidate for CIR. I will place   order for rehab consult per protocol for full assessment. Please contact me any with questions. ? ?Iridessa Harrow, MS, CCC-SLP ?Rehab Admissions Coordinator  ?[phone] (celll) ?[phone] (office) ? ?

## 2021-11-27 LAB — CBC
HCT: 41.5 % (ref 39.0–52.0)
Hemoglobin: 14.1 g/dL (ref 13.0–17.0)
MCH: 30.7 pg (ref 26.0–34.0)
MCHC: 34 g/dL (ref 30.0–36.0)
MCV: 90.2 fL (ref 80.0–100.0)
Platelets: 306 10*3/uL (ref 150–400)
RBC: 4.6 MIL/uL (ref 4.22–5.81)
RDW: 12.5 % (ref 11.5–15.5)
WBC: 16.3 10*3/uL — ABNORMAL HIGH (ref 4.0–10.5)
nRBC: 0 % (ref 0.0–0.2)

## 2021-11-27 LAB — BASIC METABOLIC PANEL
Anion gap: 13 (ref 5–15)
BUN: 9 mg/dL (ref 6–20)
CO2: 27 mmol/L (ref 22–32)
Calcium: 9 mg/dL (ref 8.9–10.3)
Chloride: 107 mmol/L (ref 98–111)
Creatinine, Ser: 0.9 mg/dL (ref 0.61–1.24)
GFR, Estimated: >60 mL/min (ref >60)
Glucose, Bld: 99 mg/dL (ref 70–99)
Potassium: 3.2 mmol/L — ABNORMAL LOW (ref 3.5–5.1)
Sodium: 147 mmol/L — ABNORMAL HIGH (ref 135–145)

## 2021-11-27 MED ORDER — POTASSIUM CHLORIDE 20 MEQ PO PACK
40.0000 meq | PACK | Freq: Once | ORAL | Status: AC
  Administered 2021-11-27: 40 meq via ORAL
  Filled 2021-11-27: qty 2

## 2021-11-27 MED ORDER — BOOST / RESOURCE BREEZE PO LIQD CUSTOM
1.0000 | Freq: Two times a day (BID) | ORAL | Status: DC
  Administered 2021-11-27 (×2): 1 via ORAL
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
## 2021-11-27 NOTE — Progress Notes (Signed)
IP rehab admissions - noted patient is homeless and lives in a shelter.  Call placed to [facility], [place], and to patient's father to discuss rehab options.  No answers, so I left voice mail messages.  Will follow up after I speak with family member.  Call for questions.  [phone]

## 2021-11-27 NOTE — TOC Progression Note (Signed)
Transition of Care [facility]) - Progression Note    Patient Details  Name: Joseph [name] MRN: [id number] Date of Birth: 11-06-1966  [facility]) CM/SW Contact  Ella Bodo, RN Phone Number: 11/27/2021, 4:18 PM  Clinical Narrative:    Met with patient to discuss discharge planning.  Patient states he lives at [place], and works at [employer].  He states he has no insurance. We discussed possibility of needing inpatient rehab at discharge, and support needed after rehab.  Patient states that he could stay with his girlfriend, Solmon Ice, if needed.  He states his parents are not in the area, and would prefer to stay with his girlfriend.  Patient gives permission for me to call [name]; left message for her on voicemail at home and work numbers. Will follow with updates as available.    Expected Discharge Plan: IP Rehab Facility Barriers to Discharge: Continued Medical Work up  Expected Discharge Plan and Services Expected Discharge Plan: [place]   Discharge Planning Services: CM Consult, [facility], [place], Medication Assistance   Living arrangements for the past 2 months: Homeless Shelter                                       Social Determinants of Health (SDOH) Interventions    Readmission Risk Interventions No flowsheet data found.  Reinaldo Raddle, RN, BSN  Trauma/Neuro ICU Case Manager [phone]

## 2021-11-27 NOTE — Progress Notes (Signed)
Plastic Surgery  POD#5 IMF  Rehab eval pending worked with PT and OT  Temp:  [98.8 F (37.1 C)-101.7 F (38.7 C)] 99.4 F (37.4 C) (01/12 0000) Pulse Rate:  [74-141] 83 (01/12 0600) Resp:  [16-32] 21 (01/12 0600) BP: (136-190)/(90-118) 169/107 (01/12 0600) SpO2:  [87 %-99 %] 92 % (01/12 0600)   PO 530  PE Patient sleeping-deferred oral exam  A/P IMF for 4-6 weeks Peridex oral care- encourage patient to brush teeth Tolerating full liquid diet   Glenna Fellows, MD [facility] Plastic & Reconstructive Surgery

## 2021-11-28 LAB — CREATININE, SERUM
Creatinine, Ser: 0.91 mg/dL (ref 0.61–1.24)
GFR, Estimated: >60 mL/min (ref >60)

## 2021-11-28 MED ORDER — ENSURE ENLIVE PO LIQD
237.0000 mL | Freq: Three times a day (TID) | ORAL | Status: DC
  Administered 2021-11-28 – 2021-12-03 (×13): 237 mL via ORAL

## 2021-11-28 MED ORDER — BOOST / RESOURCE BREEZE PO LIQD CUSTOM
1.0000 | Freq: Three times a day (TID) | ORAL | Status: DC
  Administered 2021-11-28 – 2021-12-03 (×13): 1 via ORAL

## 2021-11-28 MED ORDER — FENTANYL CITRATE PF 50 MCG/ML IJ SOSY
50.0000 ug | PREFILLED_SYRINGE | Freq: Three times a day (TID) | INTRAMUSCULAR | Status: DC | PRN
  Administered 2021-11-29 – 2021-12-01 (×4): 50 ug via INTRAVENOUS
  Filled 2021-11-28 (×4): qty 1

## 2021-11-28 MED ORDER — ACETAMINOPHEN 160 MG/5ML PO SOLN
650.0000 mg | Freq: Four times a day (QID) | ORAL | Status: DC
  Administered 2021-11-28 – 2021-12-01 (×12): 650 mg via ORAL
  Filled 2021-11-28 (×12): qty 20.3

## 2021-11-28 MED ORDER — OXYCODONE HCL 5 MG/5ML PO SOLN
5.0000 mg | ORAL | Status: DC | PRN
  Administered 2021-11-28 – 2021-12-01 (×15): 10 mg via ORAL
  Filled 2021-11-28 (×15): qty 10
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
## 2021-11-28 NOTE — Progress Notes (Signed)
Brief Nutrition Note  Noted pt's diet was advanced to dysphagia 1 with thin liquids today. Per SLP note, pt and/or nursing can thin out pureed textures with thin liquid and pull them up into a syringe for pt to self-administer PO. Per notes, pt has been doing better with intake of liquids over pureed items that have been on his full liquid meal trays thus far. Pt reported feeling hungry to SLP. RD to increase Ensure Enlive supplement from BID to TID and will increase Boost Breeze supplement from BID to TID to maximize kcal and protein intake.  Will continue to follow pt during admission.   Mertie Clause, MS, RD, LDN Inpatient Clinical Dietitian Please see [facility] for contact information.
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
## 2021-11-28 NOTE — Progress Notes (Signed)
Inpatient Rehab Admissions Coordinator:  Spoke with pt's father, Ottavio and his significant other, [name]. Both are not able to provide appropriate dispo for pt. [name] father is going to talk to other family members (pt's brother and sister) to determine if they may be able to provide support/supervision for pt. Solmon Ice is also going to speak with pt's family and local acquaintances to determine if appropriate dispo can be arranged. TOC made aware. Will continue to follow.   Gayland Curry, [place], [place] Admissions Coordinator [phone]
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
## 2021-11-29 ENCOUNTER — Other Ambulatory Visit: Payer: Self-pay

## 2021-11-29 MED ORDER — CHLORDIAZEPOXIDE HCL 5 MG PO CAPS
5.0000 mg | ORAL_CAPSULE | Freq: Three times a day (TID) | ORAL | Status: DC
  Administered 2021-11-29 (×3): 5 mg via ORAL
  Filled 2021-11-29 (×2): qty 1

## 2021-11-29 MED ORDER — INFLUENZA VAC SPLIT QUAD 0.5 ML IM SUSY
0.5000 mL | PREFILLED_SYRINGE | INTRAMUSCULAR | Status: AC
Start: 2021-11-30 — End: 2021-12-03
  Administered 2021-12-03: 0.5 mL via INTRAMUSCULAR
  Filled 2021-11-29: qty 0.5
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
## 2021-11-29 NOTE — Progress Notes (Signed)
Plastic Surgery  POD# 7 IMF  Temp:  [97.5 F (36.4 C)-98.8 F (37.1 C)] 97.9 F (36.6 C) (01/14 0735) Pulse Rate:  [79-91] 81 (01/14 0735) Resp:  [14-20] 17 (01/14 0735) BP: (124-151)/(72-96) 131/72 (01/14 0735) SpO2:  [93 %-98 %] 96 % (01/14 0735)    PE  IMF intact  Laceration repairs intact  A/P No new recs, awaiting dispo plan Plan IMF for 4-6 weeks Peridex oral care, brush teeth twice daily Ok to shower from plastics standpoint Ok to brush over lip sutures to encourage to fall out Keep wire cutters at bedside for emergencies and to be sent with patient when discharged   Glenna Fellows, MD [facility] Plastic & Reconstructive Surgery
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
## 2021-11-30 MED ORDER — CHLORDIAZEPOXIDE HCL 5 MG PO CAPS
5.0000 mg | ORAL_CAPSULE | Freq: Two times a day (BID) | ORAL | Status: DC
  Administered 2021-11-30 (×2): 5 mg via ORAL
  Filled 2021-11-30 (×2): qty 1

## 2021-11-30 MED ORDER — MUPIROCIN 2 % EX OINT
1.0000 "application " | TOPICAL_OINTMENT | Freq: Two times a day (BID) | CUTANEOUS | Status: DC
  Administered 2021-11-30 – 2021-12-03 (×6): 1 via NASAL
  Filled 2021-11-30 (×3): qty 22
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
## 2021-11-30 NOTE — Progress Notes (Signed)
Plastic Surgery  POD# 8 IMF  Temp:  [97.7 F (36.5 C)-98.2 F (36.8 C)] 97.8 F (36.6 C) (01/15 0743) Pulse Rate:  [82-96] 84 (01/15 0743) Resp:  [10-16] 14 (01/15 0743) BP: (100-143)/(56-87) 123/87 (01/15 0743) SpO2:  [93 %-99 %] 98 % (01/15 0743)   PO 480 No events  PE  IMF intact  Laceration repairs intact  A/P Informed patient I left VM for his case worker at [employer] last week Awaiting dispo plan Plan IMF for 4-6 weeks Peridex oral care, brush teeth twice daily Ok to shower from [place] to brush over lip sutures to encourage to fall out Keep wire cutters at bedside for emergencies and to be sent with patient when discharged   Irene Limbo, MD [facility] Plastic & Reconstructive Surgery

## 2021-12-01 MED ORDER — CHLORDIAZEPOXIDE HCL 5 MG PO CAPS
5.0000 mg | ORAL_CAPSULE | Freq: Every day | ORAL | Status: AC
  Administered 2021-12-01: 5 mg via ORAL
  Filled 2021-12-01: qty 1

## 2021-12-01 MED ORDER — FENTANYL CITRATE PF 50 MCG/ML IJ SOSY: 25.0000 ug | PREFILLED_SYRINGE | Freq: Three times a day (TID) | INTRAMUSCULAR | Status: DC | PRN

## 2021-12-01 MED ORDER — DIPHENHYDRAMINE-ZINC ACETATE 2-0.1 % EX CREA
TOPICAL_CREAM | Freq: Three times a day (TID) | CUTANEOUS | Status: DC | PRN
  Filled 2021-12-01: qty 28

## 2021-12-01 MED ORDER — OXYCODONE HCL 5 MG/5ML PO SOLN
10.0000 mg | ORAL | Status: DC | PRN
  Administered 2021-12-01 – 2021-12-03 (×8): 15 mg via ORAL
  Administered 2021-12-03: 10 mg via ORAL
  Filled 2021-12-01 (×6): qty 15
  Filled 2021-12-01: qty 10
  Filled 2021-12-01 (×2): qty 15

## 2021-12-01 MED ORDER — ACETAMINOPHEN 160 MG/5ML PO SOLN
1000.0000 mg | Freq: Four times a day (QID) | ORAL | Status: DC
  Administered 2021-12-01 – 2021-12-03 (×9): 1000 mg via ORAL
  Filled 2021-12-01 (×9): qty 40.6
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
## 2021-12-01 NOTE — Progress Notes (Signed)
Plastic Surgery  POD# 9 IMF  Temp:  [97.7 F (36.5 C)-98.9 F (37.2 C)] 98.2 F (36.8 C) (01/16 0700) Pulse Rate:  [77-89] 86 (01/16 0700) Resp:  [13-20] 13 (01/16 0335) BP: (110-156)/(67-88) 133/81 (01/16 0700) SpO2:  [92 %-97 %] 96 % (01/16 0700)   PO 2450  PE  IMF intact  Laceration repairs intact  A/P Stable exam Awaiting dispo plan Plan IMF for 4-6 weeks Peridex oral care, brush teeth twice daily Keep wire cutters at bedside for emergencies and to be sent with patient when discharged   Irene Limbo, MD [facility] Plastic & Reconstructive Surgery

## 2021-12-01 NOTE — Progress Notes (Addendum)
Inpatient Rehab Admissions Coordinator:    I spoke to Pt. Regarding potential CIR admission and disposition. I let him know that his father and girlfriend are unable to provide a place for him to stay at d/c. He gave me contact information for his sister, Bonita Quin, and Brother, Ethelene Browns, and I spoke with them as well. Bonita Quin states that she recently lost her son and doesn't feel able to take the patient in at this time, but is willing to help if he goes to stay with her brother or parents. Ethelene Browns stated that he was not sure his landlord would allow another person to stay in his apartment and that he would get back to me tomorrow.    Megan Salon, MS, CCC-SLP Rehab Admissions Coordinator  [phone] (celll) [phone] (office)
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
## 2021-12-02 MED ORDER — WHITE PETROLATUM EX OINT
TOPICAL_OINTMENT | CUTANEOUS | Status: DC | PRN
  Filled 2021-12-02: qty 28.35

## 2021-12-02 MED ORDER — HYDROXYZINE HCL 25 MG PO TABS
25.0000 mg | ORAL_TABLET | Freq: Three times a day (TID) | ORAL | Status: DC | PRN
  Administered 2021-12-02: 25 mg via ORAL
  Filled 2021-12-02: qty 1
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
## 2021-12-02 NOTE — TOC Progression Note (Signed)
Transition of Care [facility]) - Progression Note    Patient Details  Name: Joseph [name] MRN: [id number] Date of Birth: [birth year]  Transition of Care [facility]) CM/SW [place], [place] Phone Number: 12/02/2021, 3:27 PM  Clinical Narrative:     CSW met with pt to discuss disposition since CIR wouldn't be available. CSW discussed possible SNF though explained limitations of finding a SNF without a payor. Pt is agreeable to SNF if it is a possibility. Pt reports he has been living at [place] for the last month and prior to that was staying in hotels. 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 He states that [employer] from [place] told him to not worry about losing his bed at [facility]. CSW explained that CIR was unable to identify family that pt would be able to stay with after completing rehab and therefore would not be able to take him at CIR. CSW inquired if it was a situation where he wouldn't have 24/7 supervision or if he wasn't able to stay with them at all; pt explained that he has been trying to work something out with [name] who is his daughter's mother and believes she may let him stay there.(CSW later spoke with CIR admissions coordinator who reported that [facility] said pt could not go there). He explained that [name] lives in [place] with 2 daughters and [name]'s mom. He attempted to call [name] while CSW was in room. He explained he is trying to work something out with her but that [name]'s mom and him do not get along. CSW explained he would look into SNF as an option but also explained that without insurance, pt may end up discharging with friend/family or to [facility](if appropriate). Pt consented to CSW calling [name] as well as [name] at [employer]. CSW to complete FL2 and fax SNF requests in hub; pt would require an LOG to be approved.  CSW spoke with CIR admissions coordinator to clarify family supports. She  explained pt's parents live in a retirement community and would be able to take pt in. She also explained [name] reported he couldn't live with her. [name] Sister [name] [phone]) cannot take pt as her son just died. [name] Brother [name]([phone]) had some willingness to have pt come with him but his landlord said he could not have additional people living there.      Expected Discharge Plan: [place] Barriers to Discharge: No SNF bed, SNF Pending payor source - LOG  Expected Discharge Plan and Services Expected Discharge Plan: [place]   Discharge Planning Services: CM Consult, [facility], [place], Medication Assistance   Living arrangements for the past 2 months: Homeless Shelter                                       Social Determinants of Health (SDOH) Interventions    Readmission Risk Interventions No flowsheet data found.

## 2021-12-02 NOTE — Progress Notes (Signed)
Inpatient Rehab Admissions Coordinator:   I have discussed case with CIR MD and social work, and we are not able to offer a bed unless a dispo (other than a homeless shelter is identified.) CIR will sign off at this time.   Megan Salon, MS, CCC-SLP Rehab Admissions Coordinator  [phone] (celll) [phone] (office)
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
## 2021-12-02 NOTE — Progress Notes (Signed)
Inpatient Rehab Admissions Coordinator:   I do not have a CIR bed for this Pt. Today. Pt. Has no dispo aside from returning to he homeless shelter where he lived previously.  I am requesting rehab MD review case and see if we can offer a bed with homeless shelter as a dispo.   Megan Salon, MS, CCC-SLP Rehab Admissions Coordinator  [phone] (celll) [phone] (office)
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
## 2021-12-03 ENCOUNTER — Other Ambulatory Visit (HOSPITAL_COMMUNITY): Payer: Self-pay

## 2021-12-03 MED ORDER — OXYCODONE HCL 5 MG/5ML PO SOLN
10.0000 mg | ORAL | 0 refills | Status: DC | PRN
  Filled 2021-12-03: qty 500, 6d supply, fill #0

## 2021-12-03 MED ORDER — MUPIROCIN 2 % EX OINT: 1.0000 "application " | TOPICAL_OINTMENT | Freq: Two times a day (BID) | CUTANEOUS | 0 refills | Status: DC

## 2021-12-03 MED ORDER — CHLORHEXIDINE GLUCONATE 0.12 % MT SOLN
15.0000 mL | Freq: Two times a day (BID) | OROMUCOSAL | 0 refills | Status: DC
  Filled 2021-12-03: qty 473, 16d supply, fill #0

## 2021-12-03 MED ORDER — ACETAMINOPHEN 160 MG/5ML PO SOLN
1000.0000 mg | Freq: Four times a day (QID) | ORAL | 0 refills | Status: DC | PRN
  Filled 2021-12-03: qty 473, 4d supply, fill #0
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
## 2021-12-03 NOTE — TOC Progression Note (Addendum)
Transition of Care [facility]) - Progression Note    Patient Details  Name: Joseph [name] MRN: [id number] Date of Birth: 10/05/1966  Transition of Care [facility]) CM/SW Contact  Beckie Busing, RN Phone Number:[phone]  12/03/2021, 3:05 PM  Clinical Narrative:    MATCH for has been completed for uninsured patient to obtain 30 day supply of meds to be delivered per [facility] pharmacy. PA aware of outpatient pt orders and will enter them . CM spoke with patient and he states that he will be able to attend outpatient PT.   Expected Discharge Plan: Skilled Nursing Facility Barriers to Discharge: No SNF bed, SNF Pending payor source - LOG  Expected Discharge Plan and Services Expected Discharge Plan: Skilled Nursing Facility   Discharge Planning Services: CM Consult, [facility], [facility], Medication Assistance   Living arrangements for the past 2 months: Homeless Shelter Expected Discharge Date: 12/03/21                                     Social Determinants of Health (SDOH) Interventions    Readmission Risk Interventions No flowsheet data found.
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
## 2021-12-03 NOTE — Progress Notes (Signed)
Plastic Surgery  POD# 11 IMF  Temp:  [97.5 F (36.4 C)-98.7 F (37.1 C)] 97.5 F (36.4 C) (01/18 1515) Pulse Rate:  [72-97] 85 (01/18 1515) Resp:  [13-20] 18 (01/18 1515) BP: (119-144)/(70-90) 119/71 (01/18 1515) SpO2:  [92 %-100 %] 96 % (01/18 1515)   Planning to discharge to shelter today. Possible hotel for night with SO.  PE  IMF intact    A/P  Plan IMF for 4-6 weeks- discussed with patient removal under sedation requires  family or friend to stay with him for 24 hours at that time. If unable to do this, can remove under local in office. Peridex oral care, brush teeth twice daily Showed patient wire cutters again that are in his bags. Provided f/u appt time and my card with office location for next week, confirmed his cell phone number. Provided jaw fracture diet handout.   Glenna Fellows, MD [facility] Plastic & Reconstructive Surgery
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
## 2021-12-03 NOTE — TOC Progression Note (Addendum)
Transition of Care [facility]) - Progression Note    Patient Details  Name: Joseph [name] MRN: [id number] Date of Birth: 1965/12/01  Transition of Care [facility]) CM/SW Contact  Joseph [name], [place] Phone Number: 12/03/2021, 12:28 PM  Clinical Narrative:     CSW spoke with Joseph [name] on the phone. Provided update on CIR being unable to accept pt without a solid discharge plan. CSW explained limitations of SNF placement without payor source. CSW explained likelihood that pt may DC back to shelter if he improves before another placement is identified. Joseph [name] stated that pt cannot stay with her at DC. She explains she is attempting to assist in identifying a plan and states she plans to contact pt's sister. Sister previously said pt could not stay with her. CSW explained if sister changed her mind to let CSW know. [name] sister lives in [place].   Pt has no SNF offers now as he does not have a payor source other than potential for an LOG.  Expected Discharge Plan: Skilled Nursing Facility Barriers to Discharge: No SNF bed, SNF Pending payor source - LOG  Expected Discharge Plan and Services Expected Discharge Plan: Skilled Nursing Facility   Discharge Planning Services: CM Consult, [facility], [facility] Program, Medication Assistance   Living arrangements for the past 2 months: Homeless Shelter                                       Social Determinants of Health (SDOH) Interventions    Readmission Risk Interventions No flowsheet data found.

## 2021-12-04 ENCOUNTER — Encounter (HOSPITAL_COMMUNITY): Payer: Self-pay

## 2021-12-04 NOTE — TOC Progression Note (Signed)
Transition of Care [facility]) - Progression Note    Patient Details  Name: Joseph [name] MRN: [id number] Date of Birth: 12-13-1965  [facility]) CM/SW Contact  Erin Sons, [place] Phone Number: 12/04/2021, 8:48 AM  Clinical Narrative:     Notified that pt is walking much better and plan for DC to [facility]. CSW called [employer] and confirmed pt can return today. CSW provided RN with taxi voucher for transportation to [facility] once pt is ready.   Expected Discharge Plan: Homeless Shelter Barriers to Discharge: No Barriers Identified  Expected Discharge Plan and Services Expected Discharge Plan: Homeless Shelter   Discharge Planning Services: CM Consult, [facility], [facility] Program, Medication Assistance   Living arrangements for the past 2 months: Homeless Shelter Expected Discharge Date: 12/03/21                                     Social Determinants of Health (SDOH) Interventions    Readmission Risk Interventions No flowsheet data found.

## 2021-12-22 ENCOUNTER — Encounter (HOSPITAL_BASED_OUTPATIENT_CLINIC_OR_DEPARTMENT_OTHER): Payer: Self-pay | Admitting: Plastic Surgery

## 2022-01-02 ENCOUNTER — Other Ambulatory Visit: Payer: Self-pay

## 2022-01-02 ENCOUNTER — Encounter (HOSPITAL_BASED_OUTPATIENT_CLINIC_OR_DEPARTMENT_OTHER)
Admission: RE | Disposition: A | Discharge status: Home / Self Care | Payer: Self-pay | Source: Home / Self Care | Attending: Plastic Surgery

## 2022-01-02 ENCOUNTER — Ambulatory Visit (HOSPITAL_BASED_OUTPATIENT_CLINIC_OR_DEPARTMENT_OTHER)
Admission: RE | Admit: 2022-01-02 | Discharge: 2022-01-02 | Disposition: A | Discharge status: Home / Self Care | Payer: Self-pay | Attending: Plastic Surgery | Admitting: Plastic Surgery

## 2022-01-02 ENCOUNTER — Ambulatory Visit (HOSPITAL_BASED_OUTPATIENT_CLINIC_OR_DEPARTMENT_OTHER): Payer: Self-pay | Admitting: Anesthesiology

## 2022-01-02 ENCOUNTER — Encounter (HOSPITAL_BASED_OUTPATIENT_CLINIC_OR_DEPARTMENT_OTHER): Payer: Self-pay | Admitting: Plastic Surgery

## 2022-01-02 DIAGNOSIS — Z472 Encounter for removal of internal fixation device: Secondary | ICD-10-CM

## 2022-01-02 DIAGNOSIS — S02601B Fracture of unspecified part of body of right mandible, initial encounter for open fracture: Secondary | ICD-10-CM | POA: Insufficient documentation

## 2022-01-02 DIAGNOSIS — I10 Essential (primary) hypertension: Secondary | ICD-10-CM

## 2022-01-02 DIAGNOSIS — F17200 Nicotine dependence, unspecified, uncomplicated: Secondary | ICD-10-CM | POA: Insufficient documentation

## 2022-01-02 DIAGNOSIS — S02652A Fracture of angle of left mandible, initial encounter for closed fracture: Secondary | ICD-10-CM | POA: Insufficient documentation

## 2022-01-02 DIAGNOSIS — X58XXXA Exposure to other specified factors, initial encounter: Secondary | ICD-10-CM | POA: Insufficient documentation

## 2022-01-02 HISTORY — PX: MANDIBULAR HARDWARE REMOVAL: SHX5205

## 2022-01-02 SURGERY — REMOVAL, HARDWARE, MANDIBLE
Anesthesia: Monitor Anesthesia Care | Site: Mouth

## 2022-01-02 MED ORDER — FENTANYL CITRATE (PF) 100 MCG/2ML IJ SOLN
INTRAMUSCULAR | Status: DC | PRN
  Administered 2022-01-02: 100 ug via INTRAVENOUS

## 2022-01-02 MED ORDER — FENTANYL CITRATE (PF) 100 MCG/2ML IJ SOLN
INTRAMUSCULAR | Status: AC
  Filled 2022-01-02: qty 2

## 2022-01-02 MED ORDER — PROPOFOL 500 MG/50ML IV EMUL
INTRAVENOUS | Status: DC | PRN
  Administered 2022-01-02: 100 ug/kg/min via INTRAVENOUS

## 2022-01-02 MED ORDER — ONDANSETRON HCL 4 MG/2ML IJ SOLN
INTRAMUSCULAR | Status: AC
  Filled 2022-01-02: qty 2

## 2022-01-02 MED ORDER — MIDAZOLAM HCL 2 MG/2ML IJ SOLN
INTRAMUSCULAR | Status: AC
  Filled 2022-01-02: qty 2

## 2022-01-02 MED ORDER — AMISULPRIDE (ANTIEMETIC) 5 MG/2ML IV SOLN: 10.0000 mg | Freq: Once | INTRAVENOUS | Status: DC | PRN

## 2022-01-02 MED ORDER — PROMETHAZINE HCL 25 MG/ML IJ SOLN: 6.2500 mg | INTRAMUSCULAR | Status: DC | PRN

## 2022-01-02 MED ORDER — FENTANYL CITRATE (PF) 100 MCG/2ML IJ SOLN
25.0000 ug | INTRAMUSCULAR | Status: DC | PRN
  Administered 2022-01-02 (×2): 50 ug via INTRAVENOUS

## 2022-01-02 MED ORDER — LACTATED RINGERS IV SOLN: INTRAVENOUS | Status: DC

## 2022-01-02 MED ORDER — MIDAZOLAM HCL 5 MG/5ML IJ SOLN
INTRAMUSCULAR | Status: DC | PRN
  Administered 2022-01-02: 2 mg via INTRAVENOUS

## 2022-01-02 MED ORDER — ACETAMINOPHEN 10 MG/ML IV SOLN
INTRAVENOUS | Status: AC
  Filled 2022-01-02: qty 100

## 2022-01-02 MED ORDER — KETOROLAC TROMETHAMINE 30 MG/ML IJ SOLN
30.0000 mg | Freq: Once | INTRAMUSCULAR | Status: AC
  Administered 2022-01-02: 30 mg via INTRAVENOUS

## 2022-01-02 MED ORDER — KETOROLAC TROMETHAMINE 30 MG/ML IJ SOLN
INTRAMUSCULAR | Status: AC
  Filled 2022-01-02: qty 1

## 2022-01-02 MED ORDER — CEFAZOLIN SODIUM-DEXTROSE 2-4 GM/100ML-% IV SOLN
2.0000 g | INTRAVENOUS | Status: AC
  Administered 2022-01-02: 2 g via INTRAVENOUS

## 2022-01-02 MED ORDER — PROPOFOL 500 MG/50ML IV EMUL
INTRAVENOUS | Status: AC
  Filled 2022-01-02: qty 50

## 2022-01-02 MED ORDER — PROPOFOL 10 MG/ML IV BOLUS
INTRAVENOUS | Status: DC | PRN
  Administered 2022-01-02: 40 mg via INTRAVENOUS
  Administered 2022-01-02: 20 mg via INTRAVENOUS

## 2022-01-02 MED ORDER — LIDOCAINE HCL (CARDIAC) PF 100 MG/5ML IV SOSY
PREFILLED_SYRINGE | INTRAVENOUS | Status: DC | PRN
  Administered 2022-01-02: 40 mg via INTRAVENOUS

## 2022-01-02 MED ORDER — CEFAZOLIN SODIUM-DEXTROSE 2-4 GM/100ML-% IV SOLN
INTRAVENOUS | Status: AC
  Filled 2022-01-02: qty 100

## 2022-01-02 MED ORDER — LIDOCAINE-EPINEPHRINE 1 %-1:100000 IJ SOLN
INTRAMUSCULAR | Status: DC | PRN
  Administered 2022-01-02: 10 mL

## 2022-01-02 MED ORDER — ACETAMINOPHEN 10 MG/ML IV SOLN
1000.0000 mg | Freq: Once | INTRAVENOUS | Status: DC | PRN
  Administered 2022-01-02: 1000 mg via INTRAVENOUS

## 2022-01-02 SURGICAL SUPPLY — 29 items
BLADE SURG 15 STRL LF DISP TIS (BLADE) ×2 IMPLANT
BLADE SURG 15 STRL SS (BLADE) ×2
CANISTER SUCT 1200ML W/VALVE (MISCELLANEOUS) ×3 IMPLANT
COVER MAYO STAND STRL (DRAPES) ×1 IMPLANT
DRAPE UTILITY XL STRL (DRAPES) ×3 IMPLANT
ELECT COATED BLADE 2.86 ST (ELECTRODE) IMPLANT
ELECT REM PT RETURN 9FT ADLT (ELECTROSURGICAL)
ELECTRODE REM PT RTRN 9FT ADLT (ELECTROSURGICAL) IMPLANT
GLOVE SURG HYDRASOFT LTX SZ5.5 (GLOVE) ×3 IMPLANT
GLOVE SURG POLYISO LF SZ6.5 (GLOVE) ×1 IMPLANT
GLOVE SURG UNDER POLY LF SZ7 (GLOVE) ×1 IMPLANT
GOWN STRL REUS W/ TWL LRG LVL3 (GOWN DISPOSABLE) ×2 IMPLANT
GOWN STRL REUS W/TWL LRG LVL3 (GOWN DISPOSABLE) ×2
MARKER SKIN DUAL TIP RULER LAB (MISCELLANEOUS) IMPLANT
NDL HYPO 27GX1-1/4 (NEEDLE) ×2 IMPLANT
NEEDLE HYPO 27GX1-1/4 (NEEDLE) ×2 IMPLANT
PACK BASIN DAY SURGERY FS (CUSTOM PROCEDURE TRAY) ×3 IMPLANT
PENCIL SMOKE EVACUATOR (MISCELLANEOUS) IMPLANT
SHEET MEDIUM DRAPE 40X70 STRL (DRAPES) ×3 IMPLANT
SPIKE FLUID TRANSFER (MISCELLANEOUS) ×2 IMPLANT
SUCTION FRAZIER HANDLE 10FR (MISCELLANEOUS) ×2
SUCTION TUBE FRAZIER 10FR DISP (MISCELLANEOUS) IMPLANT
SUT CHROMIC 3 0 PS 2 (SUTURE) IMPLANT
SUT CHROMIC 4 0 PS 2 18 (SUTURE) IMPLANT
SYR CONTROL 10ML LL (SYRINGE) ×3 IMPLANT
TOWEL GREEN STERILE FF (TOWEL DISPOSABLE) ×3 IMPLANT
TRAY DSU PREP LF (CUSTOM PROCEDURE TRAY) IMPLANT
TUBE CONNECTING 20X1/4 (TUBING) ×3 IMPLANT
YANKAUER SUCT BULB TIP NO VENT (SUCTIONS) ×2 IMPLANT
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
## 2022-01-02 NOTE — Anesthesia Postprocedure Evaluation (Signed)
Anesthesia Post Note  Patient: Joseph [name]  Procedure(s) Performed: REMOVAL IMF (Mouth)     Patient location during evaluation: PACU Anesthesia Type: MAC Level of consciousness: awake Pain management: pain level controlled Vital Signs Assessment: post-procedure vital signs reviewed and stable Respiratory status: spontaneous breathing, nonlabored ventilation, respiratory function stable and patient connected to nasal cannula oxygen Cardiovascular status: stable and blood pressure returned to baseline Postop Assessment: no apparent nausea or vomiting Anesthetic complications: no   No notable events documented.  Last Vitals:  Vitals:   01/02/22 0825 01/02/22 0848  BP: (!) 153/96 (!) 138/99  Pulse: 86 71  Resp: 15 16  Temp:  36.7 C  SpO2: 96% 96%    Last Pain:  Vitals:   01/02/22 0848  TempSrc:   PainSc: 4                  Madysyn Hanken P Japleen Tornow

## 2022-01-02 NOTE — Anesthesia Preprocedure Evaluation (Addendum)
Anesthesia Evaluation  Patient identified by MRN, date of birth, ID band Patient awake    Reviewed: Allergy & Precautions, NPO status , Patient's Chart, lab work & pertinent test results  Airway Mallampati: Unable to assess  TM Distance: >3 FB Neck ROM: Full    Dental no notable dental hx.    Pulmonary Current Smoker,    Pulmonary exam normal        Cardiovascular hypertension, Pt. on medications Normal cardiovascular exam     Neuro/Psych negative neurological ROS  negative psych ROS   GI/Hepatic negative GI ROS, (+)     substance abuse  ,   Endo/Other  negative endocrine ROS  Renal/GU negative Renal ROS     Musculoskeletal negative musculoskeletal ROS (+)   Abdominal   Peds  Hematology negative hematology ROS (+)   Anesthesia Other Findings Open right mandibular body fracture Closed left mandibular angle fracture  Reproductive/Obstetrics                            Anesthesia Physical Anesthesia Plan  ASA: 2  Anesthesia Plan: MAC   Post-op Pain Management:    Induction: Intravenous  PONV Risk Score and Plan: 0 and Ondansetron, Dexamethasone, Propofol infusion, Midazolam and Treatment may vary due to age or medical condition  Airway Management Planned: Nasal Cannula  Additional Equipment:   Intra-op Plan:   Post-operative Plan:   Informed Consent: I have reviewed the patients History and Physical, chart, labs and discussed the procedure including the risks, benefits and alternatives for the proposed anesthesia with the patient or authorized representative who has indicated his/her understanding and acceptance.     Dental advisory given  Plan Discussed with: CRNA  Anesthesia Plan Comments:        Anesthesia Quick Evaluation

## 2022-01-02 NOTE — Discharge Instructions (Signed)

## 2022-01-02 NOTE — Op Note (Signed)
Operative Note   DATE OF OPERATION: 2.17.23  LOCATION: [facility]-outpatient  SURGICAL DIVISION: Plastic Surgery  PREOPERATIVE DIAGNOSES:  1. Open right mandibular body fracture 2.Closed left mandibular angle fracture   POSTOPERATIVE DIAGNOSES:  same  PROCEDURE:  Removal intermaxillary fixation  SURGEON: Irene Limbo MD MBA  ASSISTANT: none  ANESTHESIA:  MAC   EBL: 5 ml  COMPLICATIONS: None immediate.   INDICATIONS FOR PROCEDURE:  The patient, Joseph [name], is a 56 y.o. male born on 10/07/1966, is here for removal intermaxillary fixation.   FINDINGS: No open bites no cross bites following removal fixation.  DESCRIPTION OF PROCEDURE:  The patient was taken to the operating room. The patient's operative site was draped in usual fashion. A time out was performed and all information was confirmed to be correct. Local anesthetic infiltrated to perform bilateral intraorbital and mental nerve blocks. Intermaxillary wires cut and removed. Incision made in gingiva over non exposed screws. A total of 9 screws removed from maxilla and mandible. Hybrid intermaxillary system arch bars removed.  The patient was allowed to wake from anesthesia and taken to the recovery room in satisfactory condition.   SPECIMENS: none  DRAINS: none

## 2022-01-02 NOTE — Transfer of Care (Signed)
Immediate Anesthesia Transfer of Care Note  Patient: Joseph [name]  Procedure(s) Performed: REMOVAL IMF  Patient Location: PACU  Anesthesia Type:MAC  Level of Consciousness: awake, alert  and oriented  Airway & Oxygen Therapy: Patient Spontanous Breathing and Patient connected to nasal cannula oxygen  Post-op Assessment: Report given to RN and Post -op Vital signs reviewed and stable  Post vital signs: Reviewed and stable  Last Vitals:  Vitals Value Taken Time  BP 133/88 01/02/22 0800  Temp    Pulse 87 01/02/22 0801  Resp 14 01/02/22 0801  SpO2 99 % 01/02/22 0801  Vitals shown include unvalidated device data.  Last Pain:  Vitals:   01/02/22 0631  TempSrc: Oral  PainSc: 4       Patients Stated Pain Goal: 4 (53/61/44 3154)  Complications: No notable events documented.

## 2022-01-05 ENCOUNTER — Encounter (HOSPITAL_BASED_OUTPATIENT_CLINIC_OR_DEPARTMENT_OTHER): Payer: Self-pay | Admitting: Plastic Surgery

## 2022-01-07 ENCOUNTER — Telehealth: Payer: Self-pay | Admitting: *Deleted

## 2022-02-08 ENCOUNTER — Emergency Department (HOSPITAL_COMMUNITY)
Admission: EM | Admit: 2022-02-08 | Discharge: 2022-02-08 | Disposition: A | Discharge status: Home / Self Care | Payer: Self-pay | Attending: Emergency Medicine | Admitting: Emergency Medicine

## 2022-02-08 ENCOUNTER — Other Ambulatory Visit: Payer: Self-pay

## 2022-02-08 ENCOUNTER — Encounter (HOSPITAL_COMMUNITY): Payer: Self-pay | Admitting: Emergency Medicine

## 2022-02-08 DIAGNOSIS — Y990 Civilian activity done for income or pay: Secondary | ICD-10-CM | POA: Insufficient documentation

## 2022-02-08 DIAGNOSIS — X501XXA Overexertion from prolonged static or awkward postures, initial encounter: Secondary | ICD-10-CM | POA: Insufficient documentation

## 2022-02-08 DIAGNOSIS — S39012A Strain of muscle, fascia and tendon of lower back, initial encounter: Secondary | ICD-10-CM | POA: Insufficient documentation

## 2022-02-08 MED ORDER — LIDOCAINE 5 % EX PTCH: 1.0000 | MEDICATED_PATCH | CUTANEOUS | 0 refills | Status: DC

## 2022-02-08 MED ORDER — METHOCARBAMOL 500 MG PO TABS: 500.0000 mg | ORAL_TABLET | Freq: Two times a day (BID) | ORAL | 0 refills | Status: DC
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
## 2022-02-08 NOTE — ED Notes (Signed)
Pt verbalized understanding of d/c instructions, meds, and followup care. Denies questions. VSS, no distress noted. Steady gait to exit with all belongings.  ?

## 2022-02-08 NOTE — ED Provider Notes (Signed)
?[facility] EMERGENCY DEPARTMENT ?Provider Note ? ? ?CSN: [id number] ?Arrival date & time: 02/08/22  1421 ? ?  ? ?History ? ?Chief Complaint  ?Patient presents with  ? Back Pain  ? ? ?Alani Lacivita III is a 56 y.o. male who presents the emergency department complaining of lower back pain since Friday.  Patient states that he was out of work for the past 2 months after he was in the hospital and had some mandibular surgery.  He went back to work this past week, and is unsure if he turned the wrong way or picked up something heavy at work.  He states that his pain is across both sides of his back, made worse with movement.  No radiation into his legs.  No numbness or tingling.  Denies any urinary symptoms.  No fevers or chills.  No history of malignancy or IV drug use. ? ? ?Back Pain ?Associated symptoms: no abdominal pain, no fever, no numbness and no weakness   ? ?  ? ?Home Medications ?Prior to Admission medications   ?Medication Sig Start Date End Date Taking? Authorizing Provider  ?lidocaine (LIDODERM) 5 % Place 1 patch onto the skin daily. Remove & Discard patch within 12 hours or as directed by MD 02/08/22  Yes Phelan Goers T, PA-C  ?methocarbamol (ROBAXIN) 500 MG tablet Take 1 tablet (500 mg total) by mouth 2 (two) times daily. 02/08/22  Yes Charmika Macdonnell T, PA-C  ?lisinopril (ZESTRIL) 10 MG tablet Take 1 tablet (10 mg total) by mouth daily. ?Patient not taking: Reported on 04/16/2021 08/04/20   Placido Sou, PA-C  ?   ? ?Allergies    ?Codeine   ? ?Review of Systems   ?Review of Systems  ?Constitutional:  Negative for fever.  ?Gastrointestinal:  Negative for abdominal pain.  ?Genitourinary:  Negative for decreased urine volume, difficulty urinating, flank pain, frequency and urgency.  ?Musculoskeletal:  Positive for back pain.  ?Neurological:  Negative for weakness and numbness.  ?All other systems reviewed and are negative. ? ?Physical Exam ?Updated Vital Signs ?BP (!) 155/104    Pulse 85   Temp 98 ?F (36.7 ?C)   Resp 14   SpO2 99%  ?Physical Exam ?Vitals and nursing note reviewed.  ?Constitutional:   ?   Appearance: Normal appearance.  ?HENT:  ?   Head: Normocephalic and atraumatic.  ?Eyes:  ?   Conjunctiva/sclera: Conjunctivae normal.  ?Pulmonary:  ?   Effort: Pulmonary effort is normal. No respiratory distress.  ?Musculoskeletal:  ?   Comments: Bilateral lumbar muscular tenderness to palpation.  No midline spinal tenderness, step-offs or crepitus. 5 /5 strength in all extremities, sensation intact.  ?Skin: ?   General: Skin is warm and dry.  ?Neurological:  ?   Mental Status: He is alert.  ?Psychiatric:     ?   Mood and Affect: Mood normal.     ?   Behavior: Behavior normal.  ? ? ?ED Results / Procedures / Treatments   ?Labs ?(all labs ordered are listed, but only abnormal results are displayed) ?Labs Reviewed - No data to display ? ?EKG ?None ? ?Radiology ?No results found. ? ?Procedures ?Procedures  ? ? ?Medications Ordered in ED ?Medications - No data to display ? ?ED Course/ Medical Decision Making/ A&P ?  ?                        ?Medical Decision Making ?Risk ?Prescription drug management. ? ? ?  This patient is a 56 year old male who presents to the ED for concern of back pain.  ? ?Differential diagnoses prior to evaluation: ?Fracture (acute/chronic), muscle strain, cauda equina, spinal stenosis. DDD, ankylosing spondylitis, acute ligamentous injury, disk herniation, spondylolisthesis, metastatic cancer, vertebral osteomyelitis, diskitis, kidney stone, pyelonephritis, AAA, Perforated ulcer, pancreatitis, bowel obstruction, retroperitoneal hemorrhage or mass, meningitis. ? ?Past Medical History / Co-morbidities / Social History: ?Hypertension ? ?Physical Exam: ?Physical exam performed. The pertinent findings include: Bilateral lumbar muscular tenderness to palpation, without midline spinal tenderness, step-offs or crepitus.  Normal strength and sensation in all extremities.  No  numbness or tingling. ?  ?Disposition: ?After consideration of the diagnostic results and the patients response to treatment, I feel that patient is not requiring admission or inpatient treatment for her symptoms.  Did not seem most consistent with a lumbar muscular strain.  We will treat symptomatically using over-the-counter medications, as well as prescribed muscle relaxer and lidocaine patches.  As patient has no numbness, tingling, saddle anesthesia, urinary retention or incontinence, I have low concern for cauda equina/myelopathy.  He is afebrile, with no history of IV drug use or malignancy, so low suspicion for epidural abscess or spinal lesion.  Discussed reasons to return to the emergency department, and the patient is agreeable to the plan. ? ?Portions of this report may have been transcribed using voice recognition software. Every effort was made to ensure accuracy; however, inadvertent computerized transcription errors may be present.  ? ?Final Clinical Impression(s) / ED Diagnoses ?Final diagnoses:  ?Strain of lumbar region, initial encounter  ? ? ?Rx / DC Orders ?ED Discharge Orders   ? ?      Ordered  ?  methocarbamol (ROBAXIN) 500 MG tablet  2 times daily       ? 02/08/22 1535  ?  lidocaine (LIDODERM) 5 %  Every 24 hours       ? 02/08/22 1535  ? ?  ?  ? ?  ? ?  ?Su Monks, PA-C ?02/08/22 1539 ? ?  ?Rozelle Logan, DO ?02/08/22 1652 ? ?

## 2022-02-08 NOTE — Discharge Instructions (Addendum)
You were seen in emergency department today for back pain. ? ?As we discussed I think your symptoms are related to a muscular strain.  We normally treat this with over-the-counter medications including ibuprofen or Tylenol.  I am also prescribing you a muscle relaxer, you can take this twice daily as needed.  I have also sent you some patches that you can place on your back for some local relief. ? ?Continue to monitor how you are doing and return to the emergency department for any new or worsening symptoms. ?

## 2022-05-02 ENCOUNTER — Encounter (HOSPITAL_COMMUNITY): Payer: Self-pay | Admitting: Emergency Medicine

## 2022-05-02 ENCOUNTER — Emergency Department (HOSPITAL_COMMUNITY)
Admission: EM | Admit: 2022-05-02 | Discharge: 2022-05-02 | Disposition: A | Discharge status: Home / Self Care | Payer: Self-pay | Attending: Emergency Medicine | Admitting: Emergency Medicine

## 2022-05-02 ENCOUNTER — Other Ambulatory Visit: Payer: Self-pay

## 2022-05-02 ENCOUNTER — Emergency Department (HOSPITAL_COMMUNITY): Payer: Self-pay

## 2022-05-02 DIAGNOSIS — I10 Essential (primary) hypertension: Secondary | ICD-10-CM | POA: Insufficient documentation

## 2022-05-02 DIAGNOSIS — Z79899 Other long term (current) drug therapy: Secondary | ICD-10-CM | POA: Insufficient documentation

## 2022-05-02 DIAGNOSIS — R6884 Jaw pain: Secondary | ICD-10-CM | POA: Insufficient documentation

## 2022-05-02 DIAGNOSIS — R000 Tachycardia, unspecified: Secondary | ICD-10-CM | POA: Insufficient documentation

## 2022-05-02 MED ORDER — HYDROCODONE-ACETAMINOPHEN 5-325 MG PO TABS
1.0000 | ORAL_TABLET | Freq: Once | ORAL | Status: AC
  Administered 2022-05-02: 1 via ORAL
  Filled 2022-05-02: qty 1

## 2022-05-02 MED ORDER — NAPROXEN 500 MG PO TABS: 500.0000 mg | ORAL_TABLET | Freq: Two times a day (BID) | ORAL | 0 refills | Status: AC

## 2022-05-02 NOTE — Discharge Instructions (Addendum)
Discussed the CT results on today's visit.  I have prescribed a short course of anti-inflammatories to help with your pain.  Need to follow-up with your primary surgeon who performed surgery in order to obtain further management of your fracture.

## 2022-05-02 NOTE — ED Provider Notes (Signed)
[facility] EMERGENCY DEPARTMENT Provider Note   CSN: [id number] Arrival date & time: 05/02/22  0116     History  Chief Complaint  Patient presents with   Jaw Pain    Joseph [name] is a 56 y.o. male.  56 y.o male with a PMH prior surgery to his jaw status post fracture presents to the ED today with a chief complaint of left jaw pain x yesterday.  Patient reports he was eating when he felt a popping sensation to the left side of his jaw, has been experiencing pain since.  He did have surgical intervention for bilateral fracture of his mandible and states that his mouth was wired shut for several months.  He did follow-up with them in the month of March, everything seemed to be stable then.  Patient has advance in his food diet and now is eating solids, he is unsure whether this is exacerbating his symptoms.  He reports the pain is worsened every time he yawns, he feels a pressure to the left ear.  He has been taking Advil for pain control but there has not been any improvement in symptoms.  Denies any other trauma, no chest pain, no arm tingling, no shortness of breath.  The history is provided by the patient.       Home Medications Prior to Admission medications   Medication Sig Start Date End Date Taking? Authorizing Provider  naproxen (NAPROSYN) 500 MG tablet Take 1 tablet (500 mg total) by mouth 2 (two) times daily for 7 days. 05/02/22 05/09/22 Yes Jacqueleen Pulver, PA-C  lidocaine (LIDODERM) 5 % Place 1 patch onto the skin daily. Remove & Discard patch within 12 hours or as directed by MD 02/08/22   Roemhildt, Lorin T, PA-C  lisinopril (ZESTRIL) 10 MG tablet Take 1 tablet (10 mg total) by mouth daily. Patient not taking: Reported on 04/16/2021 08/04/20   Placido Sou, PA-C  methocarbamol (ROBAXIN) 500 MG tablet Take 1 tablet (500 mg total) by mouth 2 (two) times daily. 02/08/22   Roemhildt, Lorin T, PA-C      Allergies    Codeine    Review of Systems    Review of Systems  Constitutional:  Negative for fever.  HENT:  Negative for drooling, facial swelling and trouble swallowing.   Respiratory:  Negative for shortness of breath.   Cardiovascular:  Negative for chest pain.  Musculoskeletal:  Positive for arthralgias.    Physical Exam Updated Vital Signs BP (!) 159/102   Pulse (!) 121   Temp 98.2 F (36.8 C) (Oral)   Resp 16   Ht 5\' 10"  (1.778 m)   Wt 81.6 kg   SpO2 96%   BMI 25.83 kg/m  Physical Exam Vitals and nursing note reviewed.  Constitutional:      Appearance: Normal appearance.  HENT:     Head: Normocephalic.     Jaw: Tenderness and pain on movement present. No trismus, swelling or malocclusion.      Mouth/Throat:     Mouth: Mucous membranes are moist.  Cardiovascular:     Rate and Rhythm: Normal rate.  Pulmonary:     Effort: Pulmonary effort is normal.  Abdominal:     General: Abdomen is flat.  Musculoskeletal:     Cervical back: Normal range of motion and neck supple.  Skin:    General: Skin is warm and dry.  Neurological:     Mental Status: He is alert and oriented to person, place, and time.  ED Results / Procedures / Treatments   Labs (all labs ordered are listed, but only abnormal results are displayed) Labs Reviewed - No data to display  EKG None  Radiology CT Maxillofacial Wo Contrast  Result Date: 05/02/2022 CLINICAL DATA:  Concern for left mandibular dislocation. EXAM: CT MAXILLOFACIAL WITHOUT CONTRAST TECHNIQUE: Multidetector CT imaging of the maxillofacial structures was performed. Multiplanar CT image reconstructions were also generated. RADIATION DOSE REDUCTION: This exam was performed according to the departmental dose-optimization program which includes automated exposure control, adjustment of the mA and/or kV according to patient size and/or use of iterative reconstruction technique. COMPARISON:  CT dated 11/21/2021. FINDINGS: Osseous: Interval reduction of the previously seen right  mandibular body fracture with partial healing. Persistent displaced fracture of the left mandible with no healing similar to prior CT. Similar appearance of fracture of the pterygoid plates. Old depressed fracture of the right lamina Propecia. No new fracture. No mandibular dislocation. The mandibular condyle remain in the mandibular fossa of the occiput. Orbits: The globes and retro-orbital fat are preserved. Sinuses: The visualized paranasal sinuses and mastoid air cells are clear. Soft tissues: No acute findings.  No fluid collection. Limited intracranial: No acute findings. IMPRESSION: 1. No acute fracture. No dislocation of the mandible in relation to the occipital mandibular condyle. 2. Interval reduction of the previously seen right mandibular body fracture with partial healing. 3. Similar appearing displaced fracture of the left mandible with no healing. 4. Similar appearance of fracture of the pterygoid plates. 5. Old depressed fracture of the right lamina Propecia. Electronically Signed   By: Elgie Collard M.D.   On: 05/02/2022 03:03    Procedures Procedures    Medications Ordered in ED Medications  HYDROcodone-acetaminophen (NORCO/VICODIN) 5-325 MG per tablet 1 tablet (has no administration in time range)    ED Course/ Medical Decision Making/ A&P                           Medical Decision Making Risk Prescription drug management.   Patient with a PMH of fractures to bilateral sides of his mandible, previously repair in the month of February 2023 by Dr. Leta Baptist, after feeling a popping sensation exacerbating pain to the left.  During evaluation patient is overall well-appearing, there is varus to palpation along the left side of his jaw exacerbated with any sort of movement and palpation.  He did take some Advil without any improvement in his symptoms.  No other trauma, no other signs of injury.  Does report feeling a popping sensation to his left ear with any movement of his  jaw.  Did arrive in the ED hypertensive and tachycardic, however does report Bacot use prior to arrival in the ED, will have these rechecked.  CT maxillofacial was ordered while in the waiting room: 1. No acute fracture. No dislocation of the mandible in relation to  the occipital mandibular condyle.  2. Interval reduction of the previously seen right mandibular body  fracture with partial healing.  3. Similar appearing displaced fracture of the left mandible with no  healing.  4. Similar appearance of fracture of the pterygoid plates.  5. Old depressed fracture of the right lamina Propecia.   He is able to speak in full sentences and move his jaw although this causes some pain.  Discussed with him the CT result, he was given Norco to help with pain control.  No chest pain, no arm pain, no other symptoms.  I discussed  with him following up with his primary surgeon in order to have pain further evaluated or treated.  He is agreeable with plan and treatment at this time, return precautions discussed at length.  Patient stable for discharge    Portions this note were generated with Dragon dictation software. Dictation errors may occur despite best attempts at proofreading.   Final Clinical Impression(s) / ED Diagnoses Final diagnoses:  Jaw pain    Rx / DC Orders ED Discharge Orders          Ordered    naproxen (NAPROSYN) 500 MG tablet  2 times daily        05/02/22 0716              Claude Manges, PA-C 05/02/22 0716    Long, Arlyss Repress, MD 05/02/22 [id number]

## 2022-05-02 NOTE — ED Notes (Signed)
Provider at bedside at this time

## 2022-05-02 NOTE — ED Triage Notes (Signed)
Pt reported to ED with c/o pain to left side of jaw. States he has a h of mandibular fracture earlier in the year with surgical repair and chronic numbness to left lip post incident. Pt states that tonight while eating he felt "something pop" and has been experiencing pain since.

## 2022-05-16 ENCOUNTER — Emergency Department (HOSPITAL_COMMUNITY)
Admission: EM | Admit: 2022-05-16 | Discharge: 2022-05-16 | Disposition: A | Discharge status: Home / Self Care | Payer: Self-pay | Attending: Emergency Medicine | Admitting: Emergency Medicine

## 2022-05-16 ENCOUNTER — Encounter (HOSPITAL_COMMUNITY): Payer: Self-pay

## 2022-05-16 ENCOUNTER — Other Ambulatory Visit: Payer: Self-pay

## 2022-05-16 DIAGNOSIS — N50819 Testicular pain, unspecified: Secondary | ICD-10-CM | POA: Insufficient documentation

## 2022-05-16 DIAGNOSIS — R21 Rash and other nonspecific skin eruption: Secondary | ICD-10-CM | POA: Insufficient documentation

## 2022-05-16 DIAGNOSIS — Z79899 Other long term (current) drug therapy: Secondary | ICD-10-CM | POA: Insufficient documentation

## 2022-05-16 MED ORDER — DIPHENHYDRAMINE HCL 25 MG PO TABS: 25.0000 mg | ORAL_TABLET | Freq: Four times a day (QID) | ORAL | 0 refills | Status: DC | PRN

## 2022-05-16 MED ORDER — HYDROCORTISONE 1 % EX CREA: 1.0000 | TOPICAL_CREAM | Freq: Two times a day (BID) | CUTANEOUS | 2 refills | Status: DC

## 2022-05-16 MED ORDER — DIPHENHYDRAMINE HCL 25 MG PO CAPS
25.0000 mg | ORAL_CAPSULE | Freq: Once | ORAL | Status: AC
  Administered 2022-05-16: 25 mg via ORAL
  Filled 2022-05-16: qty 1

## 2022-05-16 MED ORDER — PREDNISONE 20 MG PO TABS: ORAL_TABLET | ORAL | 0 refills | Status: AC

## 2022-05-16 MED ORDER — PREDNISONE 20 MG PO TABS
60.0000 mg | ORAL_TABLET | Freq: Once | ORAL | Status: AC
  Administered 2022-05-16: 60 mg via ORAL
  Filled 2022-05-16: qty 3

## 2022-05-16 NOTE — ED Triage Notes (Addendum)
Pt has sores on his testicles. Pt does not think it is an STD. He thinks that they are just irritated.

## 2022-05-16 NOTE — Discharge Instructions (Signed)
Take the prescribed medication as directed. Follow-up with urology if any ongoing issues-- call for appt if needed. Return to the ED for new or worsening symptoms.

## 2022-05-16 NOTE — ED Provider Notes (Signed)
[facility]-EMERGENCY DEPT Provider Note   CSN: [id number] Arrival date & time: 05/16/22  2030     History  Chief Complaint  Patient presents with   Testicle Pain    Joseph [name] is a 56 y.o. male.  The history is provided by the patient and medical records.  Testicle Pain   56 year old male presenting to the ED with rash to his testicles.  States he noticed this today after taking a shower.  States skin on the testicles is "burning" and feels like he has tiny paper cuts all over them.  He does admit to using a new laundry detergent which may have aggravated his skin.  He denies any other rash.  He is not having any frank testicle pain, swelling, difficulty urinating, penile discharge.  He has no concern for STD.  Home Medications Prior to Admission medications   Medication Sig Start Date End Date Taking? Authorizing Provider  diphenhydrAMINE (BENADRYL) 25 MG tablet Take 1 tablet (25 mg total) by mouth every 6 (six) hours as needed. 05/16/22  Yes Garlon Hatchet, PA-C  hydrocortisone cream 1 % Apply 1 Application topically 2 (two) times daily. 05/16/22  Yes Garlon Hatchet, PA-C  predniSONE (DELTASONE) 20 MG tablet Take 40 mg by mouth daily for 3 days, then 20mg  by mouth daily for 3 days, then 10mg  daily for 3 days 05/16/22  Yes , PA-C  lidocaine (LIDODERM) 5 % Place 1 patch onto the skin daily. Remove & Discard patch within 12 hours or as directed by MD Patient not taking: Reported on 05/16/2022 02/08/22   Roemhildt, Lorin T, PA-C  lisinopril (ZESTRIL) 10 MG tablet Take 1 tablet (10 mg total) by mouth daily. Patient not taking: Reported on 05/16/2022 08/04/20   07/17/2022, PA-C  methocarbamol (ROBAXIN) 500 MG tablet Take 1 tablet (500 mg total) by mouth 2 (two) times daily. Patient not taking: Reported on 05/16/2022 02/08/22   Roemhildt, Lorin T, PA-C      Allergies    Codeine    Review of Systems   Review of Systems  Genitourinary:   Positive for testicular pain.  Skin:  Positive for rash.  All other systems reviewed and are negative.   Physical Exam Updated Vital Signs BP (!) 147/90   Pulse 96   Temp 98.3 F (36.8 C) (Oral)   Resp 18   Ht 5\' 9"  (1.753 m)   Wt 86.2 kg   SpO2 100%   BMI 28.06 kg/m   Physical Exam Vitals and nursing note reviewed.  Constitutional:      Appearance: He is well-developed.  HENT:     Head: Normocephalic and atraumatic.  Eyes:     Conjunctiva/sclera: Conjunctivae normal.     Pupils: Pupils are equal, round, and reactive to light.  Cardiovascular:     Rate and Rhythm: Normal rate and regular rhythm.     Heart sounds: Normal heart sounds.  Pulmonary:     Effort: Pulmonary effort is normal.     Breath sounds: Normal breath sounds.  Abdominal:     General: Bowel sounds are normal.     Palpations: Abdomen is soft.  Genitourinary:    Comments: NT present as chaperone Fine rash noted to underside of the testicles, skin appears irritated without discrete ulcers or other lesions, testicles are non-tender to touch, no swelling or masses noted, penis circumcised without discharge or lesions Musculoskeletal:        General: Normal range of motion.  Cervical back: Normal range of motion.  Skin:    General: Skin is warm and dry.  Neurological:     Mental Status: He is alert and oriented to person, place, and time.     ED Results / Procedures / Treatments   Labs (all labs ordered are listed, but only abnormal results are displayed) Labs Reviewed - No data to display  EKG None  Radiology No results found.  Procedures Procedures    Medications Ordered in ED Medications  predniSONE (DELTASONE) tablet 60 mg (60 mg Oral Given 05/16/22 2213)  diphenhydrAMINE (BENADRYL) capsule 25 mg (25 mg Oral Given 05/16/22 2213)    ED Course/ Medical Decision Making/ A&P                           Medical Decision Making Risk OTC drugs. Prescription drug management.   56 y.o. M  here with rash and skin irritation to the testicles that he noticed today after showering.  Does report changing his home laundry detergent.  Does have fine rash to the testicles, most consistent with contact dermatitis.  There are no ulcerations or lesions concerns for HSV or syphilis. Testicles are non-tender, no noted swelling or masses.  No penile discharge and he denies concerns for STDs.  Will treat symptomatically.  Advised to switch back to gentle detergent.  Does not currently have PCP so given urology follow-up if symptoms persist.  Work note provided.  Return here for new concerns.  Final Clinical Impression(s) / ED Diagnoses Final diagnoses:  Rash    Rx / DC Orders ED Discharge Orders          Ordered    predniSONE (DELTASONE) 20 MG tablet        05/16/22 2301    diphenhydrAMINE (BENADRYL) 25 MG tablet  Every 6 hours PRN        05/16/22 2301    hydrocortisone cream 1 %  2 times daily        05/16/22 2301              Garlon Hatchet, PA-C 05/16/22 2306    Jacalyn Lefevre, MD 05/16/22 [id number]

## 2024-04-17 ENCOUNTER — Ambulatory Visit: Admitting: Family Medicine

## 2024-10-18 ENCOUNTER — Encounter: Payer: Self-pay | Admitting: Physician Assistant

## 2024-10-18 ENCOUNTER — Encounter: Payer: Self-pay | Admitting: *Deleted

## 2024-10-18 ENCOUNTER — Other Ambulatory Visit: Payer: Self-pay

## 2024-10-18 ENCOUNTER — Other Ambulatory Visit: Payer: Self-pay | Admitting: Family Medicine

## 2024-10-18 MED ORDER — OMEPRAZOLE 20 MG PO CPDR
20.0000 mg | DELAYED_RELEASE_CAPSULE | Freq: Every day | ORAL | 0 refills | Status: DC
  Filled 2024-10-18: qty 30, 30d supply, fill #0

## 2024-10-18 NOTE — Progress Notes (Cosign Needed Addendum)
 Mgm [place]  S: 58 yo with HTN, GERD here for refills.    No other complaints  Thinks he takes lisinopril  40.  Hasn't had in a few weeks.   Thinks he takes prilosec for GERD.  Has been taking this a few months.  He notes this has helped.   O: Vitals:   10/18/24 1701  BP: 123/79  Pulse: 72   General: No acute distress. Abdomen: Soft, nontender, nondistended  Neurological: Alert and oriented 3. Moves all extremities 4 with equal strength. Cranial nerves II through XII grossly intact. Skin: Warm and dry. No rashes or lesions. Extremities: No clubbing or cyanosis. No edema.   A/P: HTN BP ok today off lisinopril .  Will hold off on refill.  Continue to monitor BP.  GERD Prilosec refilled.  Preventative strategies discussed.  Avoid smoking.  Elevated head of bed while sleeping.  Diet.   He needs a PCP  Meliton Monte, MD  -----------------------  Pt w/ hx GERD and HTN, requests refills of both.  Says he was on lisinopril  40 mg for BP control. See VS below, he has not been on BP med for a while. Will hold off on BP med. Pt says his BP was high when he was in jail and he was put on med at that time.   He thinks he was on Prilosec for GERD, not completely sure. Will send in Prilosec.   [facility] got to the shelter 12/02.  Needs PCP appt.      10/18/2024    5:01 PM 10/18/2024    9:43 AM 05/16/2022   10:15 PM  Vitals with BMI  Weight  193 lbs   Systolic 123 126 852  Diastolic 79 85 90  Pulse 72 82 96    Arun Herrod, PA-C 10/18/2024 5:05 PM

## 2024-10-18 NOTE — Congregational Nurse Program (Signed)
  Dept: [phone]   Congregational Nurse Program Note  Date of Encounter: 10/18/2024  Past Medical History: Past Medical History:  Diagnosis Date   Hypertension     Encounter Details:  Community Questionnaire - 10/18/24 0943       Questionnaire   Ask client: Do you give verbal consent for me to treat you today? Yes    Student Assistance N/A    Location Patient Served  [facility]    Encounter Setting CN site    Population Status Unhoused    Insurance Medicaid    Insurance/Financial Assistance Referral N/A    Medication Have Medication Insecurities    Medical Provider No    Screening Referrals Made N/A    Medical Referrals Made [facility] PCP[facility]inic   [facility] clinic   Medical Appointment Completed N/A    CNP Interventions Advocate/Support;Navigate Healthcare System    Screenings CN Performed Blood Pressure;Weight    ED Visit Averted N/A    Life-Saving Intervention Made N/A         Clinic signed paper to see nurse at [facility]. Client reports he has been out of blood pressure and acid reflux medication for two weeks. Checked vitals. Blood pressure 126/85, pulse 82, weight 193 lb (87.5 kg), SpO2 95%. [facility] [place] [facility] [place] [facility] and has no refills and no PCP. Triaged client to see MD in [facility] clinic.  Jowel Waltner W RN CN

## 2024-10-19 ENCOUNTER — Other Ambulatory Visit: Payer: Self-pay

## 2024-10-19 ENCOUNTER — Encounter: Payer: Self-pay | Admitting: *Deleted

## 2024-10-19 NOTE — Congregational Nurse Program (Signed)
  Dept: [phone]   Congregational Nurse Program Note  Date of Encounter: 10/19/2024  Past Medical History: Past Medical History:  Diagnosis Date   GERD (gastroesophageal reflux disease)    Hypertension     Encounter Details:  Community Questionnaire - 10/19/24 1231       Questionnaire   Ask client: Do you give verbal consent for me to treat you today? N/A    Student Assistance N/A    Location Patient Served  [facility]    Encounter Setting CN site    Population Status Unhoused    Insurance Medicaid    Insurance/Financial Assistance Referral N/A    Medication Have Medication Insecurities;Provided Medication Assistance    Medical Provider Yes    Screening Referrals Made N/A    Medical Referrals Made N/A    Medical Appointment Completed N/A    CNP Interventions Advocate/Support;Navigate Healthcare System;Case Management    Screenings CN Performed N/A    ED Visit Averted N/A    Life-Saving Intervention Made N/A         Received message from PA, client needs PCP established. Made an appt for PCP visit [facility] with Greig Chute Friday January 16th 8:00 [address] [place]. Received message to pick up medication from [facility] pharmacy. Picked up prilosec and brought to [facility].Client could not be located. Left medication and upcoming appt info at [facility] front desk including client's name and bed number. Wretha Laris W RN CN

## 2024-10-23 ENCOUNTER — Encounter: Payer: Self-pay | Admitting: *Deleted

## 2024-10-23 NOTE — Congregational Nurse Program (Signed)
  Dept: [phone]   Congregational Nurse Program Note  Date of Encounter: 10/23/2024  Past Medical History: Past Medical History:  Diagnosis Date   GERD (gastroesophageal reflux disease)    Hypertension     Encounter Details:  Community Questionnaire - 10/23/24 1045       Questionnaire   Ask client: Do you give verbal consent for me to treat you today? Yes    Student Assistance N/A    Location Patient Served  [facility]    Encounter Setting CN site    Population Status Unhoused    Insurance Medicaid    Insurance/Financial Assistance Referral N/A    Medication N/A    Medical Provider Yes   upcoming appt   Screening Referrals Made N/A    Medical Referrals Made [facility]    Medical Appointment Completed N/A    CNP Interventions Advocate/Support;Navigate Healthcare System    Screenings CN Performed Blood Pressure;Weight    ED Visit Averted N/A    Life-Saving Intervention Made N/A         Client signed paper to see nurse at [facility]. Client is requesting medication for cold symptoms. Explained medications have to be given by MD. Completed triage for to see MD in [facility] clinic Wednesday afternoon. Blood pressure 116/79, pulse 71, temperature 98 F (36.7 C), temperature source Oral, height 5' 9 (1.753 m), weight 192 lb 3.2 oz (87.2 kg), SpO2 96%. Reviewed upcoming appt in January to establish PCP. Client asked about getting a colonoscopy screening. Explained this would be reviewed as PCP established at upcoming appt in January. Offered support and encouragement. Royale Swamy W RN CN

## 2024-10-26 ENCOUNTER — Encounter: Payer: Self-pay | Admitting: Physician Assistant

## 2024-10-26 NOTE — Progress Notes (Addendum)
 Pt here for sinus issues, has some greenish drainage. Feels stuffy and congested  Has not had Flu shot.   Pt had a cough but that has improved.  Lungs clear on exam. No strep on throat exam. Oxygenation and other VS good.   Pt encouraged to decrease, discontinue tobacco.  Pt given antihistamine, saline nasal spray.   Shona Shad, PA-C 10/26/2024 10:25 AM

## 2024-11-10 ENCOUNTER — Emergency Department (HOSPITAL_COMMUNITY)
Admission: EM | Admit: 2024-11-10 | Discharge: 2024-11-10 | Disposition: A | Discharge status: Home / Self Care | Payer: Self-pay | Attending: Emergency Medicine | Admitting: Emergency Medicine

## 2024-11-10 ENCOUNTER — Encounter (HOSPITAL_COMMUNITY): Payer: Self-pay

## 2024-11-10 ENCOUNTER — Other Ambulatory Visit: Payer: Self-pay

## 2024-11-10 ENCOUNTER — Emergency Department (HOSPITAL_COMMUNITY): Payer: Self-pay

## 2024-11-10 DIAGNOSIS — J329 Chronic sinusitis, unspecified: Secondary | ICD-10-CM | POA: Insufficient documentation

## 2024-11-10 LAB — RESP PANEL BY RT-PCR (RSV, FLU A&B, COVID)  RVPGX2
Influenza A by PCR: NEGATIVE
Influenza B by PCR: NEGATIVE
Resp Syncytial Virus by PCR: NEGATIVE
SARS Coronavirus 2 by RT PCR: NEGATIVE

## 2024-11-10 MED ORDER — AMOXICILLIN 500 MG PO CAPS: 500.0000 mg | ORAL_CAPSULE | Freq: Three times a day (TID) | ORAL | 0 refills | Status: DC

## 2024-11-10 NOTE — ED Triage Notes (Signed)
Cough and congestion x 2 weeks. Denies fevers .

## 2024-11-10 NOTE — ED Provider Notes (Signed)
 " [address] [facility] AT [facility] Provider Note   CSN: [id number] Arrival date & time: 11/10/24  1312     Patient presents with: URI   Joseph [name] is a 58 y.o. male.   58 year old male presents today for complaint of 2-week duration of sinus congestion, postnasal drip, cough, and some shortness of breath.  No chest pain.  No prior history of CAD.  The history is provided by the patient. No language interpreter was used.       Prior to Admission medications  Medication Sig Start Date End Date Taking? Authorizing Provider  amoxicillin  (AMOXIL ) 500 MG capsule Take 1 capsule (500 mg total) by mouth 3 (three) times daily for 7 days. 11/10/24 11/17/24 Yes Zanita Millman, PA-C  omeprazole  (PRILOSEC) 20 MG capsule Take 1 capsule (20 mg total) by mouth daily. 10/18/24 11/18/24  Perri DELENA Meliton Mickey., MD  diphenhydrAMINE  (BENADRYL ) 25 MG tablet Take 1 tablet (25 mg total) by mouth every 6 (six) hours as needed. 05/16/22   Jarold Olam HERO, PA-C  hydrocortisone  cream 1 % Apply 1 Application topically 2 (two) times daily. 05/16/22   Jarold Olam HERO, PA-C  lidocaine  (LIDODERM ) 5 % Place 1 patch onto the skin daily. Remove & Discard patch within 12 hours or as directed by MD Patient not taking: Reported on 05/16/2022 02/08/22   Roemhildt, Lorin T, PA-C  lisinopril  (ZESTRIL ) 10 MG tablet Take 1 tablet (10 mg total) by mouth daily. Patient not taking: Reported on 05/16/2022 08/04/20   Joldersma, Logan, PA-C  methocarbamol  (ROBAXIN ) 500 MG tablet Take 1 tablet (500 mg total) by mouth 2 (two) times daily. Patient not taking: Reported on 05/16/2022 02/08/22   Roemhildt, Lorin T, PA-C  predniSONE  (DELTASONE ) 20 MG tablet Take 40 mg by mouth daily for 3 days, then 20mg  by mouth daily for 3 days, then 10mg  daily for 3 days 05/16/22   Jarold Olam HERO, PA-C    Allergies: Codeine    Review of Systems  Constitutional:  Negative for chills and fever.  HENT:  Positive for congestion and  postnasal drip. Negative for sore throat, trouble swallowing and voice change.   Respiratory:  Positive for cough and shortness of breath.   Cardiovascular:  Negative for leg swelling.  Neurological:  Negative for light-headedness.  All other systems reviewed and are negative.   Updated Vital Signs BP 118/75 (BP Location: Right Arm)   Pulse 91   Temp 97.7 F (36.5 C)   Resp 18   Ht 5' 9 (1.753 m)   Wt 88.5 kg   SpO2 98%   BMI 28.80 kg/m   Physical Exam Vitals and nursing note reviewed.  Constitutional:      General: He is not in acute distress.    Appearance: Normal appearance. He is not ill-appearing.  HENT:     Head: Normocephalic and atraumatic.     Nose: Nose normal.  Eyes:     Conjunctiva/sclera: Conjunctivae normal.  Cardiovascular:     Rate and Rhythm: Normal rate and regular rhythm.  Pulmonary:     Effort: Pulmonary effort is normal. No respiratory distress.  Musculoskeletal:        General: No deformity. Normal range of motion.     Cervical back: Normal range of motion.  Skin:    Findings: No rash.  Neurological:     Mental Status: He is alert.     (all labs ordered are listed, but only abnormal results are displayed) Labs Reviewed  RESP PANEL BY RT-PCR (RSV, FLU A&B, COVID)  [id number]    EKG: None  Radiology: DG Chest 2 View Result Date: 11/10/2024 EXAM: 2 VIEW(S) XRAY OF THE CHEST 11/10/2024 01:44:00 PM COMPARISON: Comparison 11/22/2021. CLINICAL HISTORY: cough, dyspnea FINDINGS: LINES, TUBES AND DEVICES: Interval extubation and removal of nasogastric tube. LUNGS AND PLEURA: No focal pulmonary opacity. No pleural effusion. No pneumothorax. HEART AND MEDIASTINUM: No acute abnormality of the cardiac and mediastinal silhouettes. BONES AND SOFT TISSUES: Thoracic degenerative changes. No acute osseous abnormality. IMPRESSION: 1. No acute process. Electronically signed by: Dayne Hassell MD 11/10/2024 02:15 PM EST RP Workstation: [id number]     Procedures    Medications Ordered in the ED - No data to display                                  Medical Decision Making Amount and/or Complexity of Data Reviewed Radiology: ordered.  Risk Prescription drug management.   58 year old male presents today with above-mentioned complaints.  Overall well-appearing.  Does have a cough.  He states cough is productive.  Chest x-ray without acute cardiopulmonary process.  Personally reviewed and agree with radiology interpretation.  Respiratory panel negative.  Likely sinusitis.  Given duration of symptoms will also prescribe amoxicillin .  Importance of sinus rinse discussed.  Patient feels comfortable with this plan.  Discharged in stable condition.  Final diagnoses:  Sinusitis, unspecified chronicity, unspecified location    ED Discharge Orders          Ordered    amoxicillin  (AMOXIL ) 500 MG capsule  3 times daily        11/10/24 1459               Hildegard Loge, PA-C 11/10/24 1501    Cottie Donnice PARAS, MD 11/11/24 [phone]  "

## 2024-11-10 NOTE — ED Triage Notes (Signed)
Pt c/o cough and congestion

## 2024-11-10 NOTE — ED Provider Triage Note (Signed)
 Emergency Medicine Provider Triage Evaluation Note  Joseph [name] , a 58 y.o. male  was evaluated in triage.  Pt complains of 2 weeks of cough and shortness of breath.  No chest pain.  Productive cough..  Review of Systems  Positive: As above Negative: As above  Physical Exam  BP 118/75 (BP Location: Right Arm)   Pulse 91   Temp 97.7 F (36.5 C)   Resp 18   Ht 5' 9 (1.753 m)   Wt 88.5 kg   SpO2 98%   BMI 28.80 kg/m  Gen:   Awake, no distress   Resp:  Normal effort  MSK:   Moves extremities without difficulty  Other:    Medical Decision Making  Medically screening exam initiated at 1:29 PM.  Appropriate orders placed.  Joseph [name] was informed that the remainder of the evaluation will be completed by another provider, this initial triage assessment does not replace that evaluation, and the importance of remaining in the ED until their evaluation is complete.     Hildegard Loge, PA-C 11/10/24 1329

## 2024-11-10 NOTE — Discharge Instructions (Addendum)
 Your chest x-ray did not show evidence of pneumonia.  Respiratory panel negative for COVID, flu, RSV.  You likely have sinus infection.  Antibiotic sent in.  Perform sinus rinse.  Follow-up with your primary care doctor.  Return for any concerning symptoms.

## 2024-11-13 ENCOUNTER — Emergency Department (HOSPITAL_COMMUNITY)
Admission: EM | Admit: 2024-11-13 | Discharge: 2024-11-13 | Disposition: A | Discharge status: Home / Self Care | Payer: Self-pay | Attending: Emergency Medicine | Admitting: Emergency Medicine

## 2024-11-13 ENCOUNTER — Other Ambulatory Visit: Payer: Self-pay

## 2024-11-13 ENCOUNTER — Encounter (HOSPITAL_COMMUNITY): Payer: Self-pay | Admitting: Emergency Medicine

## 2024-11-13 ENCOUNTER — Emergency Department (HOSPITAL_COMMUNITY): Payer: Self-pay

## 2024-11-13 DIAGNOSIS — J0100 Acute maxillary sinusitis, unspecified: Secondary | ICD-10-CM | POA: Insufficient documentation

## 2024-11-13 DIAGNOSIS — Z79899 Other long term (current) drug therapy: Secondary | ICD-10-CM | POA: Insufficient documentation

## 2024-11-13 DIAGNOSIS — J069 Acute upper respiratory infection, unspecified: Secondary | ICD-10-CM | POA: Insufficient documentation

## 2024-11-13 MED ORDER — BENZONATATE 100 MG PO CAPS: 100.0000 mg | ORAL_CAPSULE | Freq: Three times a day (TID) | ORAL | 0 refills | Status: DC

## 2024-11-13 MED ORDER — AMOXICILLIN-POT CLAVULANATE 875-125 MG PO TABS: 1.0000 | ORAL_TABLET | Freq: Two times a day (BID) | ORAL | 0 refills | Status: AC

## 2024-11-13 NOTE — ED Triage Notes (Signed)
 Patient reports persistent productive cough with chest congestion for several days .

## 2024-11-13 NOTE — Discharge Instructions (Addendum)
 You were seen for your sinus infection and viral upper respiratory tract infection in the emergency department.   At home, please take the Augmentin we prescribed you.  Take the Tessalon Perles and tea with honey for your cough.    Check your MyChart online for the results of any tests that had not resulted by the time you left the emergency department.   Follow-up with your primary doctor in 2-3 days regarding your visit.    Return immediately to the emergency department if you experience any of the following: Difficulty breathing, or any other concerning symptoms.    Thank you for visiting our Emergency Department. It was a pleasure taking care of you today.

## 2024-11-13 NOTE — ED Provider Notes (Signed)
 " [place] EMERGENCY DEPARTMENT AT [facility] Provider Note   CSN: [id number] Arrival date & time: 11/13/24  9468     Patient presents with: Cough (Chest Congestion )   Joseph [name] is a 58 y.o. male.   58 year old male no relevant past medical history presents to the emergency department with cough, congestion, fevers, and chills.  Having going on for several days.  Seen in the emergency department on the 26th with a negative COVID and flu.  Was prescribed amoxicillin  but has not picked it up yet.  Reports that symptoms have persisted and he was at work and felt poorly so decided to come into the emergency department.       Prior to Admission medications  Medication Sig Start Date End Date Taking? Authorizing Provider  amoxicillin -clavulanate (AUGMENTIN) 875-125 MG tablet Take 1 tablet by mouth every 12 (twelve) hours for 7 days. 11/13/24 11/20/24 Yes Yolande Lamar BROCKS, MD  benzonatate (TESSALON) 100 MG capsule Take 1 capsule (100 mg total) by mouth every 8 (eight) hours. 11/13/24  Yes Yolande Lamar BROCKS, MD  omeprazole  (PRILOSEC) 20 MG capsule Take 1 capsule (20 mg total) by mouth daily. 10/18/24 11/18/24  Perri DELENA Meliton Mickey., MD  diphenhydrAMINE  (BENADRYL ) 25 MG tablet Take 1 tablet (25 mg total) by mouth every 6 (six) hours as needed. 05/16/22   Jarold Olam HERO, PA-C  hydrocortisone  cream 1 % Apply 1 Application topically 2 (two) times daily. 05/16/22   Jarold Olam HERO, PA-C  lidocaine  (LIDODERM ) 5 % Place 1 patch onto the skin daily. Remove & Discard patch within 12 hours or as directed by MD Patient not taking: Reported on 05/16/2022 02/08/22   Roemhildt, Lorin T, PA-C  lisinopril  (ZESTRIL ) 10 MG tablet Take 1 tablet (10 mg total) by mouth daily. Patient not taking: Reported on 05/16/2022 08/04/20   Joldersma, Logan, PA-C  methocarbamol  (ROBAXIN ) 500 MG tablet Take 1 tablet (500 mg total) by mouth 2 (two) times daily. Patient not taking: Reported on 05/16/2022 02/08/22    Roemhildt, Lorin T, PA-C  predniSONE  (DELTASONE ) 20 MG tablet Take 40 mg by mouth daily for 3 days, then 20mg  by mouth daily for 3 days, then 10mg  daily for 3 days 05/16/22   Jarold Olam HERO, PA-C    Allergies: Codeine    Review of Systems  Updated Vital Signs BP 135/83 (BP Location: Right Arm)   Pulse 98   Temp 98.6 F (37 C) (Oral)   Resp 16   SpO2 100%   Physical Exam Constitutional:      Appearance: Normal appearance.  HENT:     Head: Normocephalic and atraumatic.     Comments: Frontal and maxillary sinus tenderness to palpation    Right Ear: External ear normal.     Left Ear: External ear normal.     Nose: Congestion present.  Eyes:     Extraocular Movements: Extraocular movements intact.     Conjunctiva/sclera: Conjunctivae normal.     Pupils: Pupils are equal, round, and reactive to light.  Cardiovascular:     Rate and Rhythm: Normal rate and regular rhythm.  Pulmonary:     Effort: Pulmonary effort is normal.     Breath sounds: Normal breath sounds.  Neurological:     Mental Status: He is alert.     (all labs ordered are listed, but only abnormal results are displayed) Labs Reviewed - No data to display  EKG: None  Radiology: DG Chest 2 View Result Date: 11/13/2024  EXAM: 2 VIEW(S) XRAY OF THE CHEST 11/13/2024 06:27:00 AM COMPARISON: 11/10/2024 CLINICAL HISTORY: Cough / chest congestion FINDINGS: LUNGS AND PLEURA: No focal pulmonary opacity. No pleural effusion. No pneumothorax. HEART AND MEDIASTINUM: No acute abnormality of the cardiac and mediastinal silhouettes. BONES AND SOFT TISSUES: Thoracic degenerative changes. No acute osseous abnormality. IMPRESSION: 1. No acute process. Electronically signed by: Waddell Calk MD 11/13/2024 06:32 AM EST RP Workstation: [id number]     Procedures   Medications Ordered in the ED - No data to display                                  Medical Decision Making Amount and/or Complexity of Data Reviewed Radiology:  ordered.  Risk Prescription drug management.   Joseph [name] is a 58 year old male no relevant past medical history presents to the emergency department with cough, congestion, fevers, and chills.   Initial Ddx:  Sinusitis, URI, pneumonia, bronchitis  MDM/Course:  Patient presents to the emergency department with persistent URI symptoms.  Has been going on for several days.  Also is having some sinus pain and congestion.  Has not yet picked up the amoxicillin  that was prescribed to him.  Had a negative COVID and flu 3 days ago.  On exam is not in acute distress.  Does have congestion with maxillary sinus tenderness palpation.  Chest x-ray without pneumonia.  Upon re-evaluation patient remains well-appearing and is asking for a work note.  He was provided with this and changed his prescription to Augmentin to take for his sinus infection.  This patient presents to the ED for concern of complaints listed in HPI, this involves an extensive number of treatment options, and is a complaint that carries with it a high risk of complications and morbidity. Disposition including potential need for admission considered.   Dispo: DC Home. Return precautions discussed including, but not limited to, those listed in the AVS. Allowed pt time to ask questions which were answered fully prior to dc.  I have reviewed the patients home medications and made adjustments as needed Records reviewed ED Visit Notes I independently reviewed the following imaging with scope of interpretation limited to determining acute life threatening conditions related to emergency care: Chest x-ray and agree with the radiologist interpretation with the following exceptions: none I personally reviewed and interpreted cardiac monitoring: normal sinus rhythm  I personally reviewed and interpreted the pt's EKG: see above for interpretation   Portions of this note were generated with Dragon dictation software. Dictation errors may  occur despite best attempts at proofreading.     Final diagnoses:  Acute maxillary sinusitis, recurrence not specified  Viral URI with cough    ED Discharge Orders          Ordered    amoxicillin -clavulanate (AUGMENTIN) 875-125 MG tablet  Every 12 hours        11/13/24 1102    benzonatate (TESSALON) 100 MG capsule  Every 8 hours        11/13/24 1102               Yolande Lamar BROCKS, MD 11/13/24 1943  "

## 2024-11-22 ENCOUNTER — Encounter: Payer: Self-pay | Admitting: Physician Assistant

## 2024-11-22 NOTE — Progress Notes (Signed)
 Pt here to confirm appt w/ PCP >> 01/16 at 8 am w/ Ms Greig Chute, NP at [facility].   He went to the ER for sinus infection on 12/29. Was prescribed ABX but could not get it filled. Sx have improved. Mucus seen but no infection, given Saline nasal spray and Flonase for congestion.   BP previously high, has been off cocaine since before T-day.   Was in [place] in July 2025, was put on BP med at that time. Was doing cocaine.  Lisinopril  titrated up to 40 mg during that time.   From [place], he went to [facility] [place] and got out in October.   Has been off BP med for several weeks, BP not very high, will not restart at this time. Encouraged to stay off drugs.   Dentition is poor, will be [facility] >> They can then refer him for anything else needed.   Otherwise doing well.   Shona Shad, PA-C 11/22/2024 3:22 PM

## 2024-11-23 ENCOUNTER — Encounter: Payer: Self-pay | Admitting: *Deleted

## 2024-11-23 NOTE — Congregational Nurse Program (Signed)
" °  Dept: [phone]   Congregational Nurse Program Note  Date of Encounter: 11/23/2024  Past Medical History: Past Medical History:  Diagnosis Date   GERD (gastroesophageal reflux disease)    Hypertension     Encounter Details:  Community Questionnaire - 11/23/24 0946       Questionnaire   Ask client: Do you give verbal consent for me to treat you today? N/A    Student Assistance N/A    Location Patient Served  [facility]    Encounter Setting Phone/Text/Email    Population Status Unhoused    Insurance Medicaid    Insurance/Financial Assistance Referral N/A    Medication N/A    Medical Provider Yes   upcoming PCP appt   Medical Referrals Made NA    Medical Appointment Completed N/A    Screenings CN Performed (remember to also record results) NA    CNP Interventions Navigate Healthcare System;Advocate/Support    ED Visit Averted N/A         Received message to assist with dental appt. [facility] and made an appt for January 27th 11:00. Writer could not locate client at shelter. Left written information about appt including upcoming PCP appt in an envelope at [facility] front desk with client's name and bed number. Joseph [name] W RN CN   "

## 2024-11-24 ENCOUNTER — Telehealth: Payer: Self-pay | Admitting: Critical Care Medicine

## 2024-11-24 NOTE — Telephone Encounter (Signed)
 Good Morning,  No labs are in Epic for this patient.

## 2024-11-24 NOTE — Telephone Encounter (Signed)
Sorry wrong patient 

## 2024-11-24 NOTE — Telephone Encounter (Signed)
 This pt is coming in Monday for labs in am   orders entered, pls let lab know

## 2024-12-01 ENCOUNTER — Encounter: Payer: Self-pay | Admitting: Family

## 2024-12-01 ENCOUNTER — Ambulatory Visit (INDEPENDENT_AMBULATORY_CARE_PROVIDER_SITE_OTHER): Payer: Self-pay | Admitting: Family

## 2024-12-01 VITALS — BP 124/78 | HR 79 | Ht 69.0 in | Wt 191.0 lb

## 2024-12-01 DIAGNOSIS — Z1211 Encounter for screening for malignant neoplasm of colon: Secondary | ICD-10-CM

## 2024-12-01 DIAGNOSIS — Z9889 Other specified postprocedural states: Secondary | ICD-10-CM

## 2024-12-01 DIAGNOSIS — K219 Gastro-esophageal reflux disease without esophagitis: Secondary | ICD-10-CM

## 2024-12-01 DIAGNOSIS — Z7689 Persons encountering health services in other specified circumstances: Secondary | ICD-10-CM

## 2024-12-01 DIAGNOSIS — J0100 Acute maxillary sinusitis, unspecified: Secondary | ICD-10-CM

## 2024-12-01 DIAGNOSIS — J069 Acute upper respiratory infection, unspecified: Secondary | ICD-10-CM

## 2024-12-01 DIAGNOSIS — E785 Hyperlipidemia, unspecified: Secondary | ICD-10-CM

## 2024-12-01 MED ORDER — OMEPRAZOLE 20 MG PO CPDR: 20.0000 mg | DELAYED_RELEASE_CAPSULE | Freq: Every day | ORAL | 0 refills | Status: AC

## 2024-12-01 MED ORDER — ATORVASTATIN CALCIUM 20 MG PO TABS: 20.0000 mg | ORAL_TABLET | Freq: Every day | ORAL | 0 refills | Status: AC

## 2024-12-01 MED ORDER — AMOXICILLIN-POT CLAVULANATE 875-125 MG PO TABS: 1.0000 | ORAL_TABLET | Freq: Two times a day (BID) | ORAL | 0 refills | Status: AC

## 2024-12-01 MED ORDER — BENZONATATE 100 MG PO CAPS: 100.0000 mg | ORAL_CAPSULE | Freq: Two times a day (BID) | ORAL | 0 refills | Status: AC | PRN

## 2024-12-01 NOTE — Progress Notes (Signed)
 "  Subjective:    Joseph [name] - 59 y.o. male MRN [id number]  Date of birth: [birth year]  HPI  Joseph [name] is to establish care.   Current issues and/or concerns: - Doing well on Atorvastatin , no issues/concerns.  - Doing well on Omeprazole , no issues/concerns.  - State history of bilateral jaw fractures 4 years ago. Denies recent trauma/injury and red flag symptoms. States he was told left jaw did not heal correctly. He would like referral to specialist.  - Patient seen on 11/13/2024 (5 hours) at  [facility] Emergency Department at [facility] for acute maxillary sinusitis, recurrence not specified and additional diagnosis. Today patient reports feeling stable and denies red flag symptoms. States he never went to the pharmacy to pickup the prescriptions from the Emergency Department. States he has been using nasal spray instead. - Due for colon cancer screening.   ROS per HPI    Health Maintenance:  Health Maintenance Due  Topic Date Due   Hepatitis C Screening  Never done   Pneumococcal Vaccine: 50+ Years (1 of 2 - PCV) Never done   Hepatitis B Vaccines 19-59 Average Risk (1 of 3 - 19+ 3-dose series) Never done   Colonoscopy  Never done   Zoster Vaccines- Shingrix (1 of 2) Never done   Influenza Vaccine  06/16/2024   COVID-19 Vaccine (1 - 2025-26 season) Never done     Past Medical History: Patient Active Problem List   Diagnosis Date Noted   Facial trauma 11/21/2021      Social History   reports that he has been smoking cigarettes. He has a 2.5 pack-year smoking history. He has never used smokeless tobacco. He reports that he does not currently use alcohol . He reports current drug use. Drugs: Marijuana and Cocaine.   Family History  family history includes Hypertension in his father.   Medications: reviewed and updated   Objective:   Physical Exam BP 124/78   Pulse 79   Ht 5' 9 (1.753 m)   Wt 191 lb (86.6 kg)   SpO2 98%   BMI 28.21 kg/m    Physical Exam HENT:     Head: Normocephalic and atraumatic.     Jaw: There is normal jaw occlusion.     Right Ear: Tympanic membrane, ear canal and external ear normal.     Left Ear: Tympanic membrane, ear canal and external ear normal.     Nose: Nose normal.     Mouth/Throat:     Mouth: Mucous membranes are moist.     Pharynx: Oropharynx is clear.  Eyes:     Extraocular Movements: Extraocular movements intact.     Conjunctiva/sclera: Conjunctivae normal.     Pupils: Pupils are equal, round, and reactive to light.  Cardiovascular:     Rate and Rhythm: Normal rate and regular rhythm.     Pulses: Normal pulses.     Heart sounds: Normal heart sounds.  Pulmonary:     Effort: Pulmonary effort is normal.     Breath sounds: Normal breath sounds.  Musculoskeletal:        General: Normal range of motion.     Cervical back: Normal range of motion and neck supple.  Neurological:     General: No focal deficit present.     Mental Status: He is alert and oriented to person, place, and time.  Psychiatric:        Mood and Affect: Mood normal.        Behavior:  Behavior normal.     Assessment & Plan:  1. Encounter to establish care (Primary) - Patient presents today to establish care. During the interim follow-up with primary provider as scheduled.  - Return for annual physical examination, labs, and health maintenance. Arrive fasting meaning having no food for at least 8 hours prior to appointment. You may have only water or black coffee. Please take scheduled medications as normal.  2. Hyperlipidemia, unspecified hyperlipidemia type - Continue Atorvastatin  as prescribed. Counseled on medication adherence/adverse effects.  - Follow-up with primary provider in 3 months or sooner if needed.  - atorvastatin  (LIPITOR) 20 MG tablet; Take 1 tablet (20 mg total) by mouth daily.  Dispense: 90 tablet; Refill: 0  3. Gastroesophageal reflux disease, unspecified whether esophagitis present -  Continue Omeprazole  as prescribed. Counseled on medication adherence/adverse effects.  - Follow-up with primary provider in 3 months or sooner if needed.  - omeprazole  (PRILOSEC) 20 MG capsule; Take 1 capsule (20 mg total) by mouth daily.  Dispense: 90 capsule; Refill: 0  4. Acute maxillary sinusitis, recurrence not specified 5. Viral URI with cough - Patient today in office with no cardiopulmonary/acute distress.  - Amoxicillin -Clavulanate and Benzonatate  as prescribed. Counseled on medication adherence/adverse effects.  - Follow-up with primary provider as scheduled. - amoxicillin -clavulanate (AUGMENTIN ) 875-125 MG tablet; Take 1 tablet by mouth 2 (two) times daily for 7 days.  Dispense: 14 tablet; Refill: 0 - benzonatate  (TESSALON ) 100 MG capsule; Take 1 capsule (100 mg total) by mouth 2 (two) times daily as needed for cough.  Dispense: 90 capsule; Refill: 0  6. Colon cancer screening - Referral to Gastroenterology for colon cancer screening by colonoscopy. - Ambulatory referral to Gastroenterology  7. History of jaw surgery - Referral to Orthopedic Surgery for evaluation/management. - Ambulatory referral to Orthopedic Surgery   Patient was given clear instructions to go to Emergency Department or return to medical center if symptoms don't improve, worsen, or new problems develop.The patient verbalized understanding.  I discussed the assessment and treatment plan with the patient. The patient was provided an opportunity to ask questions and all were answered. The patient agreed with the plan and demonstrated an understanding of the instructions.   The patient was advised to call back or seek an in-person evaluation if the symptoms worsen or if the condition fails to improve as anticipated.    Greig Chute, NP 12/01/2024, 8:38 AM Primary Care at [facility]  "

## 2024-12-06 ENCOUNTER — Encounter: Payer: Self-pay | Admitting: Critical Care Medicine

## 2024-12-06 NOTE — Progress Notes (Signed)
 Pt saw Ms Greig Chute, NP to establish care on 01/16.   He is concerned about his prostate. His father mentioned that his family has a history of prostate problems.   He has not problems w/ urination, no S&S of infection.   His PCP referred him for a colonoscopy. He wonders if he can get a prostate check.  He was told that they should ask him about that when he gets the colonoscopy. If they don't ask him, he should ask them.  He is concerned about his vision. Has not had an eye exam in years.  Was advised that he should contact his PCP vis [facility] to ask her about this.   He mentioned that the main problem is close-up vision. Will get the Nurse to get him a pair of 2.0 readers, will have to leave at the desk since he is at work tomorrow.   No other issues or concerns.   Shona Barrett 12/06/2024

## 2024-12-18 ENCOUNTER — Emergency Department (HOSPITAL_COMMUNITY)
Admission: EM | Admit: 2024-12-18 | Discharge: 2024-12-18 | Disposition: A | Discharge status: Home / Self Care | Payer: Self-pay | Attending: Emergency Medicine | Admitting: Emergency Medicine

## 2024-12-18 ENCOUNTER — Encounter (HOSPITAL_COMMUNITY): Payer: Self-pay

## 2024-12-18 ENCOUNTER — Other Ambulatory Visit: Payer: Self-pay

## 2024-12-18 DIAGNOSIS — Z79899 Other long term (current) drug therapy: Secondary | ICD-10-CM | POA: Insufficient documentation

## 2024-12-18 DIAGNOSIS — R6884 Jaw pain: Secondary | ICD-10-CM | POA: Insufficient documentation

## 2024-12-18 DIAGNOSIS — I10 Essential (primary) hypertension: Secondary | ICD-10-CM | POA: Insufficient documentation

## 2024-12-18 MED ORDER — NAPROXEN 250 MG PO TABS
500.0000 mg | ORAL_TABLET | Freq: Once | ORAL | Status: AC
  Administered 2024-12-18: 500 mg via ORAL
  Filled 2024-12-18: qty 2

## 2024-12-20 ENCOUNTER — Other Ambulatory Visit: Payer: Self-pay | Admitting: Critical Care Medicine

## 2024-12-20 ENCOUNTER — Encounter: Payer: Self-pay | Admitting: Critical Care Medicine

## 2024-12-20 DIAGNOSIS — Z125 Encounter for screening for malignant neoplasm of prostate: Secondary | ICD-10-CM

## 2024-12-20 MED ORDER — SILDENAFIL CITRATE 50 MG PO TABS: 50.0000 mg | ORAL_TABLET | Freq: Every day | ORAL | 0 refills | Status: AC | PRN

## 2024-12-20 NOTE — Progress Notes (Unsigned)
 Psa order

## 2024-12-21 ENCOUNTER — Telehealth: Payer: Self-pay | Admitting: *Deleted

## 2024-12-21 NOTE — Telephone Encounter (Signed)
 lab need scheduled Received: Joseph [name], Joseph BRAVO, MD  P Pce-P[facility] Joseph [name] I saw this patient today at [facility] ,  he needs a PSA lab, I put an order in for the patient , can you ensure he comes to your site for the lab draw?  ty   I sent this message to scheduler to call patient...[email or web address]

## 2024-12-21 NOTE — Progress Notes (Signed)
 Patient is seen in the [facility] and is requesting a PSA to be drawn he does have erectile dysfunction as well he is also concerned about BPH  PSA was ordered and will be drawn at [facility] where his primary care provider is located.  He also wished a short-term supply of Viagra  and this was prescribed as well.  No other concerns at this visit

## 2025-03-01 ENCOUNTER — Ambulatory Visit: Payer: Self-pay | Admitting: Family

## 2025-08-01 ENCOUNTER — Encounter: Payer: Self-pay | Admitting: Family
